# Patient Record
Sex: Male | Born: 1935 | Race: White | Hispanic: No | Marital: Married | Smoking: Former smoker
Health system: Southern US, Community
[De-identification: ages and names within clinical notes are randomized; demographics above are authoritative.]

## PROBLEM LIST (undated history)

## (undated) DIAGNOSIS — G8929 Other chronic pain: Secondary | ICD-10-CM

## (undated) DIAGNOSIS — F419 Anxiety disorder, unspecified: Secondary | ICD-10-CM

## (undated) DIAGNOSIS — R51 Headache: Secondary | ICD-10-CM

## (undated) DIAGNOSIS — M1990 Unspecified osteoarthritis, unspecified site: Secondary | ICD-10-CM

## (undated) DIAGNOSIS — K219 Gastro-esophageal reflux disease without esophagitis: Secondary | ICD-10-CM

## (undated) DIAGNOSIS — R519 Headache, unspecified: Secondary | ICD-10-CM

## (undated) DIAGNOSIS — I10 Essential (primary) hypertension: Secondary | ICD-10-CM

## (undated) DIAGNOSIS — J45909 Unspecified asthma, uncomplicated: Secondary | ICD-10-CM

## (undated) DIAGNOSIS — M545 Low back pain: Secondary | ICD-10-CM

## (undated) DIAGNOSIS — N419 Inflammatory disease of prostate, unspecified: Secondary | ICD-10-CM

## (undated) DIAGNOSIS — M25559 Pain in unspecified hip: Secondary | ICD-10-CM

## (undated) DIAGNOSIS — M542 Cervicalgia: Secondary | ICD-10-CM

## (undated) DIAGNOSIS — M4800 Spinal stenosis, site unspecified: Secondary | ICD-10-CM

## (undated) HISTORY — PX: TONSILLECTOMY: SUR1361

## (undated) HISTORY — PX: MELANOMA EXCISION: SHX5266

---
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
## 1998-08-07 ENCOUNTER — Ambulatory Visit (HOSPITAL_COMMUNITY): Admission: RE | Admit: 1998-08-07 | Discharge: 1998-08-07 | Payer: Self-pay | Admitting: Gastroenterology

## 1999-01-08 ENCOUNTER — Ambulatory Visit (HOSPITAL_COMMUNITY): Admission: RE | Admit: 1999-01-08 | Discharge: 1999-01-08 | Payer: Self-pay | Admitting: Gastroenterology

## 2005-06-24 ENCOUNTER — Encounter: Admission: RE | Admit: 2005-06-24 | Discharge: 2005-06-24 | Payer: Self-pay | Admitting: Family Medicine

## 2005-06-24 IMAGING — US US EXTREM LOW VENOUS*L*
1 series · 14 of 24 positions shown · non-contrast
Comparison: none

CLINICAL DATA: Left leg pain and swelling. 
 ULTRASOUND VENOUS IMAGING LEFT LEG:
TECHNIQUE: Gray-scale sonography with compression, as well as color and duplex Doppler ultrasound, were performed to evaluate the deep venous system from the level of the common femoral vein through the popliteal and proximal calf veins.

[Series 1: unknown · 14 of 35 slices shown]
[im 1/35]
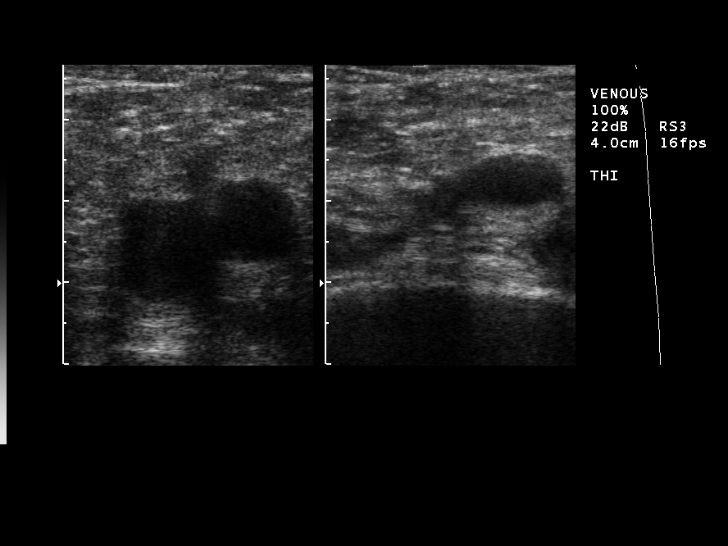
[im 3/35]
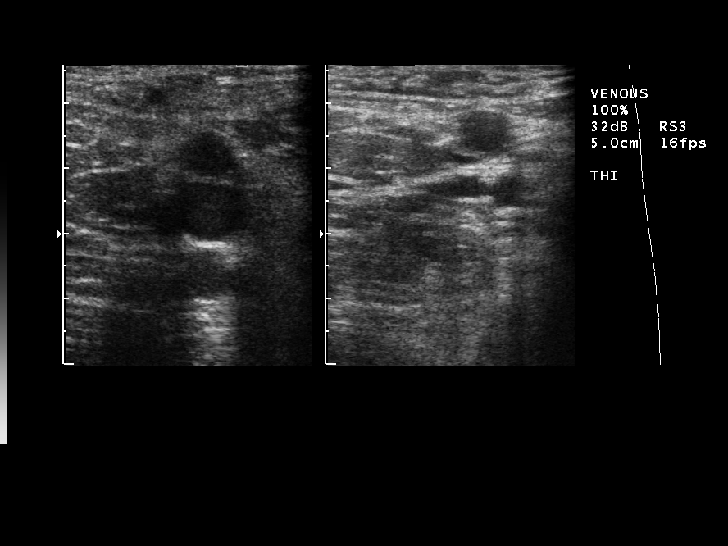
[im 6/35]
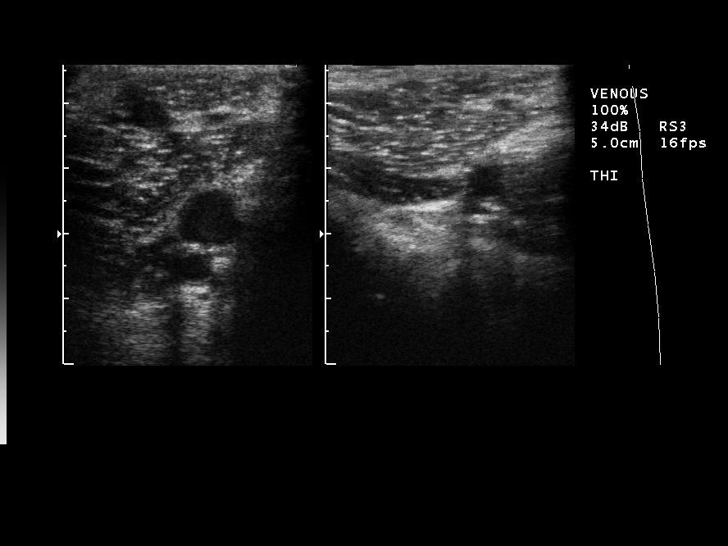
[im 9/35]
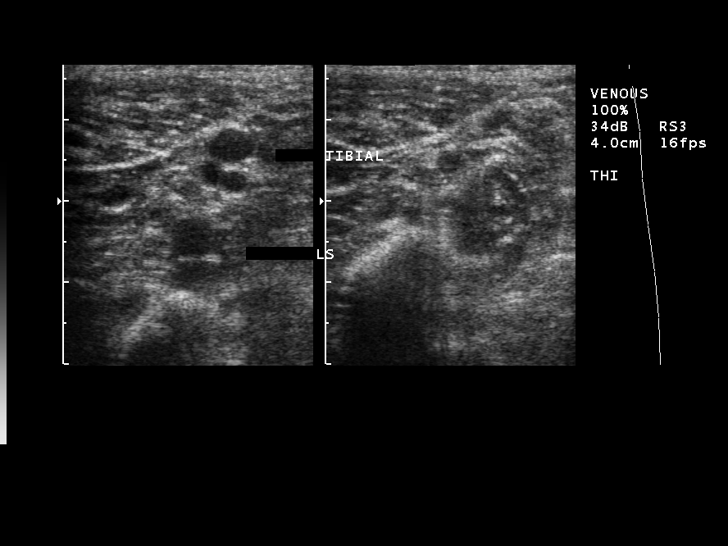
[im 11/35]
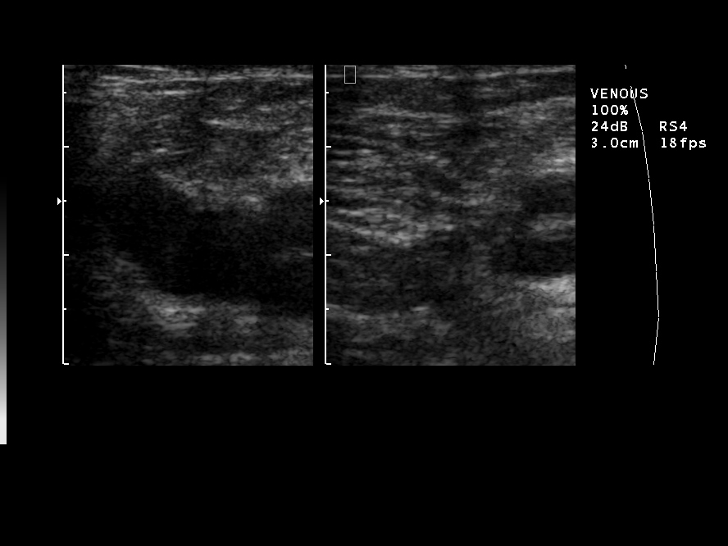
[im 14/35]
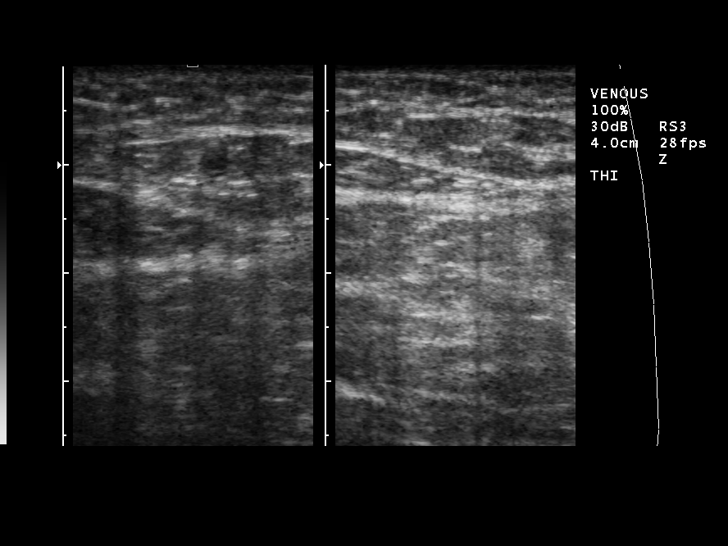
[im 17/35]
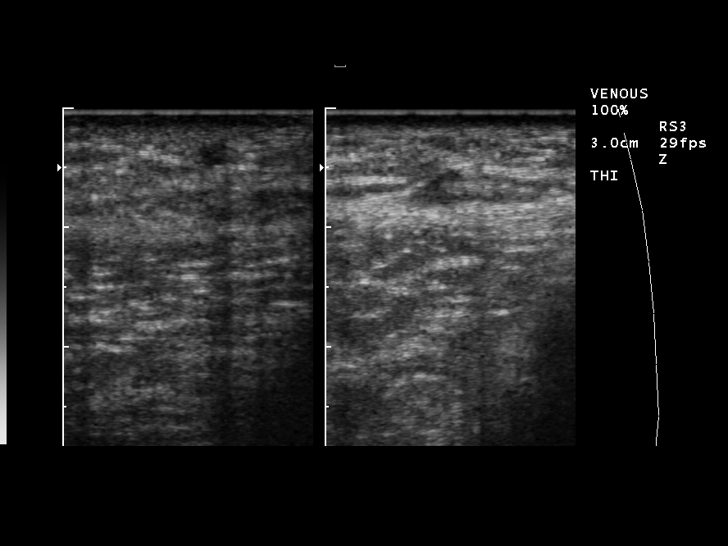
[im 18/35]
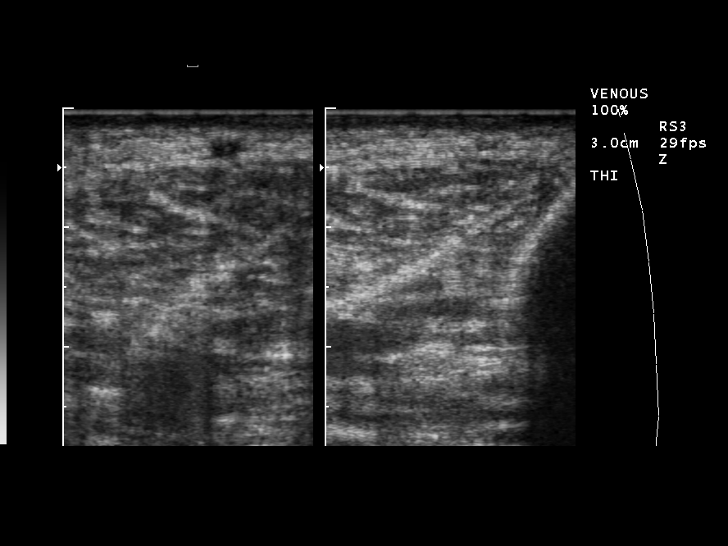
[im 21/35]
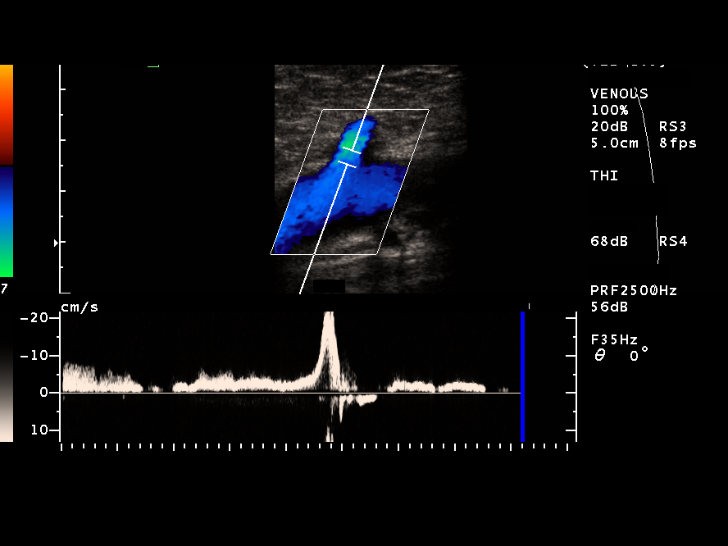
[im 24/35]
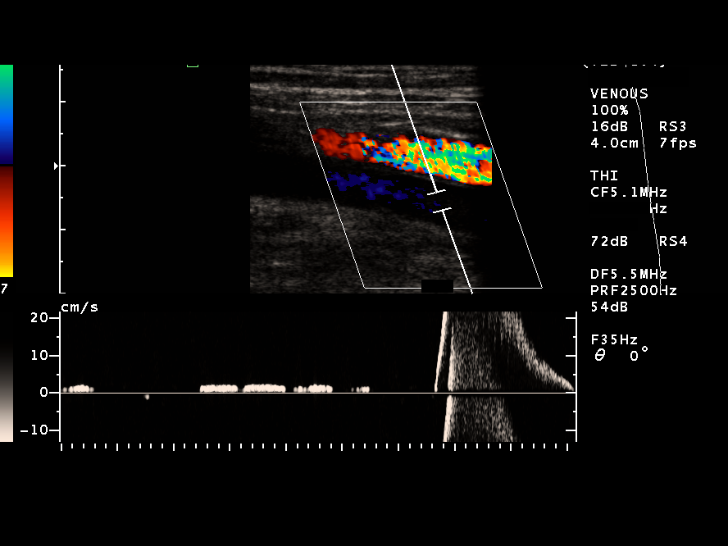
[im 27/35]
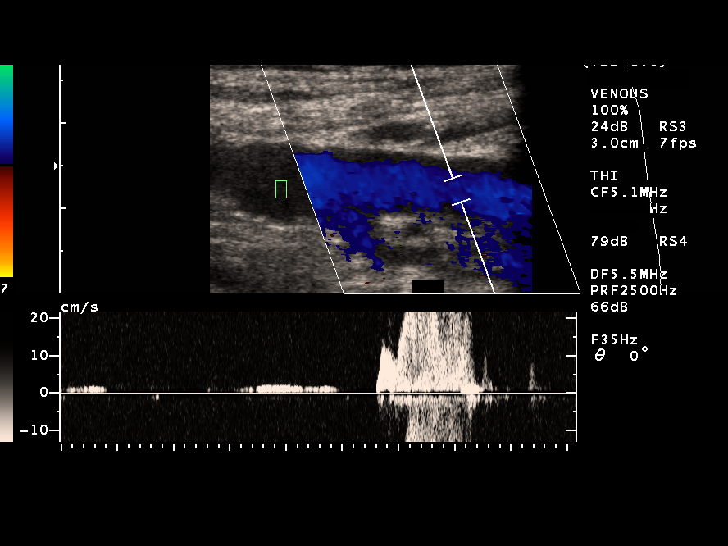
[im 29/35]
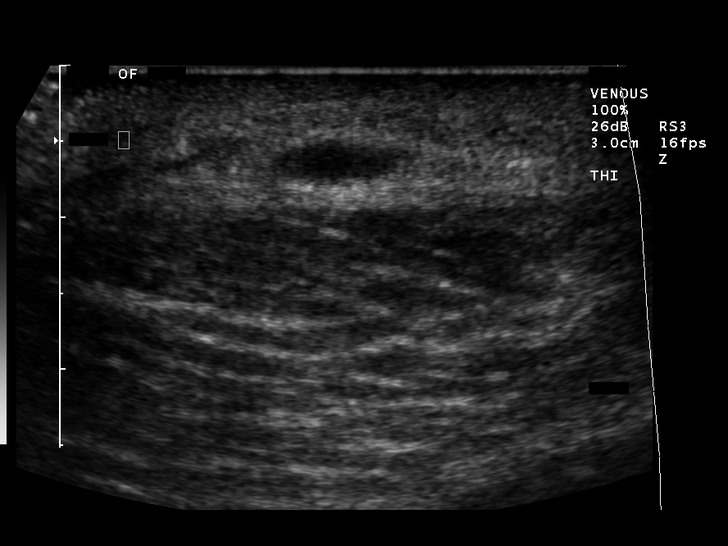
[im 32/35]
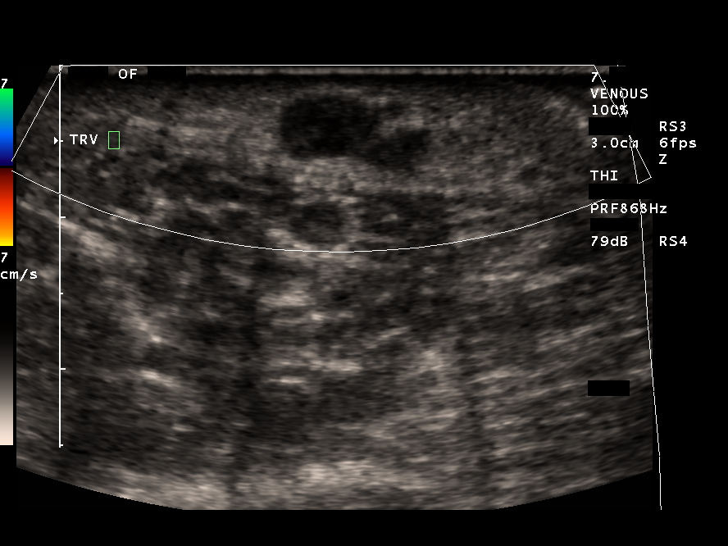
[im 35/35]
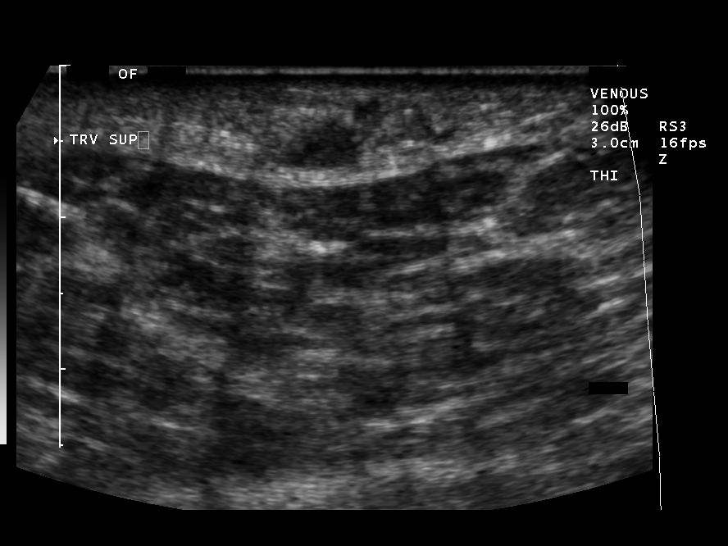

[14 of 24 positions shown; findings below may reference images not displayed]

FINDINGS: The left saphenous ? femoral junction, left common femoral vein, left profunda femoral vein, left superficial femoral vein, and left popliteal vein all compress and augment normally.  There is a small amount of fluid within the soft tissues of the mid medial left calf of approximately 2.3 x 0.6 x 1.1cm.  This may represent hematoma, but does not appear to be associated with the joint space in which case ruptured Baker?s cyst would be a consideration.
IMPRESSION: No evidence of DVT of the left leg.  Some fluid is noted in the soft tissues of the mid medial left calf which may represent hematoma.

## 2011-01-22 ENCOUNTER — Other Ambulatory Visit: Payer: Self-pay | Admitting: Dermatology

## 2011-03-17 ENCOUNTER — Other Ambulatory Visit: Payer: Self-pay | Admitting: Dermatology

## 2011-07-07 ENCOUNTER — Other Ambulatory Visit: Payer: Self-pay | Admitting: Dermatology

## 2012-04-21 ENCOUNTER — Other Ambulatory Visit: Payer: Self-pay | Admitting: Dermatology

## 2012-11-01 ENCOUNTER — Other Ambulatory Visit: Payer: Self-pay | Admitting: Dermatology

## 2013-07-28 DIAGNOSIS — M542 Cervicalgia: Secondary | ICD-10-CM | POA: Diagnosis not present

## 2013-07-31 DIAGNOSIS — M542 Cervicalgia: Secondary | ICD-10-CM | POA: Diagnosis not present

## 2013-08-03 DIAGNOSIS — M542 Cervicalgia: Secondary | ICD-10-CM | POA: Diagnosis not present

## 2013-08-07 DIAGNOSIS — M542 Cervicalgia: Secondary | ICD-10-CM | POA: Diagnosis not present

## 2013-08-10 DIAGNOSIS — M542 Cervicalgia: Secondary | ICD-10-CM | POA: Diagnosis not present

## 2013-08-14 DIAGNOSIS — M5030 Other cervical disc degeneration, unspecified cervical region: Secondary | ICD-10-CM | POA: Diagnosis not present

## 2013-09-24 DIAGNOSIS — M47812 Spondylosis without myelopathy or radiculopathy, cervical region: Secondary | ICD-10-CM | POA: Diagnosis not present

## 2013-09-26 DIAGNOSIS — M47812 Spondylosis without myelopathy or radiculopathy, cervical region: Secondary | ICD-10-CM | POA: Diagnosis not present

## 2013-11-20 DIAGNOSIS — Z0000 Encounter for general adult medical examination without abnormal findings: Secondary | ICD-10-CM | POA: Diagnosis not present

## 2013-11-20 DIAGNOSIS — N401 Enlarged prostate with lower urinary tract symptoms: Secondary | ICD-10-CM | POA: Diagnosis not present

## 2013-11-20 DIAGNOSIS — I10 Essential (primary) hypertension: Secondary | ICD-10-CM | POA: Diagnosis not present

## 2013-11-20 DIAGNOSIS — M47812 Spondylosis without myelopathy or radiculopathy, cervical region: Secondary | ICD-10-CM | POA: Diagnosis not present

## 2013-11-30 DIAGNOSIS — M47812 Spondylosis without myelopathy or radiculopathy, cervical region: Secondary | ICD-10-CM | POA: Diagnosis not present

## 2014-01-30 ENCOUNTER — Other Ambulatory Visit: Payer: Self-pay | Admitting: Dermatology

## 2014-01-30 DIAGNOSIS — L570 Actinic keratosis: Secondary | ICD-10-CM | POA: Diagnosis not present

## 2014-01-30 DIAGNOSIS — L821 Other seborrheic keratosis: Secondary | ICD-10-CM | POA: Diagnosis not present

## 2014-01-30 DIAGNOSIS — Z85828 Personal history of other malignant neoplasm of skin: Secondary | ICD-10-CM | POA: Diagnosis not present

## 2014-01-30 DIAGNOSIS — D485 Neoplasm of uncertain behavior of skin: Secondary | ICD-10-CM | POA: Diagnosis not present

## 2014-01-30 DIAGNOSIS — D239 Other benign neoplasm of skin, unspecified: Secondary | ICD-10-CM | POA: Diagnosis not present

## 2014-01-30 DIAGNOSIS — L723 Sebaceous cyst: Secondary | ICD-10-CM | POA: Diagnosis not present

## 2014-01-30 DIAGNOSIS — C44519 Basal cell carcinoma of skin of other part of trunk: Secondary | ICD-10-CM | POA: Diagnosis not present

## 2014-02-28 DIAGNOSIS — C44519 Basal cell carcinoma of skin of other part of trunk: Secondary | ICD-10-CM | POA: Diagnosis not present

## 2014-05-23 DIAGNOSIS — R7309 Other abnormal glucose: Secondary | ICD-10-CM | POA: Diagnosis not present

## 2014-05-23 DIAGNOSIS — E785 Hyperlipidemia, unspecified: Secondary | ICD-10-CM | POA: Diagnosis not present

## 2014-05-23 DIAGNOSIS — N401 Enlarged prostate with lower urinary tract symptoms: Secondary | ICD-10-CM | POA: Diagnosis not present

## 2014-05-23 DIAGNOSIS — J309 Allergic rhinitis, unspecified: Secondary | ICD-10-CM | POA: Diagnosis not present

## 2014-05-23 DIAGNOSIS — I10 Essential (primary) hypertension: Secondary | ICD-10-CM | POA: Diagnosis not present

## 2014-05-23 DIAGNOSIS — F418 Other specified anxiety disorders: Secondary | ICD-10-CM | POA: Diagnosis not present

## 2014-10-16 DIAGNOSIS — L821 Other seborrheic keratosis: Secondary | ICD-10-CM | POA: Diagnosis not present

## 2014-10-16 DIAGNOSIS — Z85828 Personal history of other malignant neoplasm of skin: Secondary | ICD-10-CM | POA: Diagnosis not present

## 2014-10-16 DIAGNOSIS — Z86018 Personal history of other benign neoplasm: Secondary | ICD-10-CM | POA: Diagnosis not present

## 2014-10-16 DIAGNOSIS — D225 Melanocytic nevi of trunk: Secondary | ICD-10-CM | POA: Diagnosis not present

## 2014-10-16 DIAGNOSIS — L723 Sebaceous cyst: Secondary | ICD-10-CM | POA: Diagnosis not present

## 2014-10-16 DIAGNOSIS — Q825 Congenital non-neoplastic nevus: Secondary | ICD-10-CM | POA: Diagnosis not present

## 2014-10-16 DIAGNOSIS — D2271 Melanocytic nevi of right lower limb, including hip: Secondary | ICD-10-CM | POA: Diagnosis not present

## 2014-10-16 DIAGNOSIS — L570 Actinic keratosis: Secondary | ICD-10-CM | POA: Diagnosis not present

## 2015-04-26 DIAGNOSIS — Z86018 Personal history of other benign neoplasm: Secondary | ICD-10-CM | POA: Diagnosis not present

## 2015-04-26 DIAGNOSIS — D2271 Melanocytic nevi of right lower limb, including hip: Secondary | ICD-10-CM | POA: Diagnosis not present

## 2015-04-26 DIAGNOSIS — D225 Melanocytic nevi of trunk: Secondary | ICD-10-CM | POA: Diagnosis not present

## 2015-04-26 DIAGNOSIS — L821 Other seborrheic keratosis: Secondary | ICD-10-CM | POA: Diagnosis not present

## 2015-04-26 DIAGNOSIS — L723 Sebaceous cyst: Secondary | ICD-10-CM | POA: Diagnosis not present

## 2015-04-26 DIAGNOSIS — Z85828 Personal history of other malignant neoplasm of skin: Secondary | ICD-10-CM | POA: Diagnosis not present

## 2015-04-26 DIAGNOSIS — D485 Neoplasm of uncertain behavior of skin: Secondary | ICD-10-CM | POA: Diagnosis not present

## 2015-06-05 DIAGNOSIS — E785 Hyperlipidemia, unspecified: Secondary | ICD-10-CM | POA: Diagnosis not present

## 2015-06-05 DIAGNOSIS — N138 Other obstructive and reflux uropathy: Secondary | ICD-10-CM | POA: Diagnosis not present

## 2015-06-05 DIAGNOSIS — F418 Other specified anxiety disorders: Secondary | ICD-10-CM | POA: Diagnosis not present

## 2015-06-05 DIAGNOSIS — I10 Essential (primary) hypertension: Secondary | ICD-10-CM | POA: Diagnosis not present

## 2015-06-05 DIAGNOSIS — N401 Enlarged prostate with lower urinary tract symptoms: Secondary | ICD-10-CM | POA: Diagnosis not present

## 2015-06-05 DIAGNOSIS — R7309 Other abnormal glucose: Secondary | ICD-10-CM | POA: Diagnosis not present

## 2015-07-03 DIAGNOSIS — N401 Enlarged prostate with lower urinary tract symptoms: Secondary | ICD-10-CM | POA: Diagnosis not present

## 2015-07-03 DIAGNOSIS — R361 Hematospermia: Secondary | ICD-10-CM | POA: Diagnosis not present

## 2015-07-03 DIAGNOSIS — R350 Frequency of micturition: Secondary | ICD-10-CM | POA: Diagnosis not present

## 2015-07-03 DIAGNOSIS — R3912 Poor urinary stream: Secondary | ICD-10-CM | POA: Diagnosis not present

## 2015-07-03 DIAGNOSIS — R351 Nocturia: Secondary | ICD-10-CM | POA: Diagnosis not present

## 2015-09-30 DIAGNOSIS — R310 Gross hematuria: Secondary | ICD-10-CM | POA: Diagnosis not present

## 2015-09-30 DIAGNOSIS — R350 Frequency of micturition: Secondary | ICD-10-CM | POA: Diagnosis not present

## 2015-09-30 DIAGNOSIS — R3912 Poor urinary stream: Secondary | ICD-10-CM | POA: Diagnosis not present

## 2015-09-30 DIAGNOSIS — N419 Inflammatory disease of prostate, unspecified: Secondary | ICD-10-CM | POA: Diagnosis not present

## 2015-09-30 DIAGNOSIS — R972 Elevated prostate specific antigen [PSA]: Secondary | ICD-10-CM | POA: Diagnosis not present

## 2015-09-30 DIAGNOSIS — Z0000 Encounter for general adult medical examination without abnormal findings: Secondary | ICD-10-CM | POA: Diagnosis not present

## 2015-10-10 DIAGNOSIS — Z0000 Encounter for general adult medical examination without abnormal findings: Secondary | ICD-10-CM | POA: Diagnosis not present

## 2015-10-10 DIAGNOSIS — R310 Gross hematuria: Secondary | ICD-10-CM | POA: Diagnosis not present

## 2015-10-10 DIAGNOSIS — N281 Cyst of kidney, acquired: Secondary | ICD-10-CM | POA: Diagnosis not present

## 2015-10-29 DIAGNOSIS — Z85828 Personal history of other malignant neoplasm of skin: Secondary | ICD-10-CM | POA: Diagnosis not present

## 2015-10-29 DIAGNOSIS — L723 Sebaceous cyst: Secondary | ICD-10-CM | POA: Diagnosis not present

## 2015-10-29 DIAGNOSIS — D1800 Hemangioma unspecified site: Secondary | ICD-10-CM | POA: Diagnosis not present

## 2015-10-29 DIAGNOSIS — D225 Melanocytic nevi of trunk: Secondary | ICD-10-CM | POA: Diagnosis not present

## 2015-10-29 DIAGNOSIS — L570 Actinic keratosis: Secondary | ICD-10-CM | POA: Diagnosis not present

## 2015-10-29 DIAGNOSIS — Z86018 Personal history of other benign neoplasm: Secondary | ICD-10-CM | POA: Diagnosis not present

## 2015-10-29 DIAGNOSIS — L821 Other seborrheic keratosis: Secondary | ICD-10-CM | POA: Diagnosis not present

## 2015-10-29 DIAGNOSIS — D2271 Melanocytic nevi of right lower limb, including hip: Secondary | ICD-10-CM | POA: Diagnosis not present

## 2015-11-02 DIAGNOSIS — M5136 Other intervertebral disc degeneration, lumbar region: Secondary | ICD-10-CM | POA: Diagnosis not present

## 2015-11-02 DIAGNOSIS — M9903 Segmental and somatic dysfunction of lumbar region: Secondary | ICD-10-CM | POA: Diagnosis not present

## 2015-11-02 DIAGNOSIS — M5137 Other intervertebral disc degeneration, lumbosacral region: Secondary | ICD-10-CM | POA: Diagnosis not present

## 2015-11-02 DIAGNOSIS — M9904 Segmental and somatic dysfunction of sacral region: Secondary | ICD-10-CM | POA: Diagnosis not present

## 2015-11-03 ENCOUNTER — Emergency Department (HOSPITAL_COMMUNITY)
Admission: EM | Admit: 2015-11-03 | Discharge: 2015-11-04 | Disposition: A | Discharge status: Home / Self Care | Payer: Medicare Other | Attending: Emergency Medicine | Admitting: Emergency Medicine

## 2015-11-03 ENCOUNTER — Encounter (HOSPITAL_COMMUNITY): Payer: Self-pay | Admitting: Emergency Medicine

## 2015-11-03 DIAGNOSIS — M25551 Pain in right hip: Secondary | ICD-10-CM | POA: Insufficient documentation

## 2015-11-03 DIAGNOSIS — R319 Hematuria, unspecified: Secondary | ICD-10-CM | POA: Diagnosis not present

## 2015-11-03 DIAGNOSIS — Z792 Long term (current) use of antibiotics: Secondary | ICD-10-CM | POA: Diagnosis not present

## 2015-11-03 DIAGNOSIS — Z880 Allergy status to penicillin: Secondary | ICD-10-CM | POA: Diagnosis not present

## 2015-11-03 DIAGNOSIS — M1990 Unspecified osteoarthritis, unspecified site: Secondary | ICD-10-CM | POA: Diagnosis not present

## 2015-11-03 DIAGNOSIS — Z7982 Long term (current) use of aspirin: Secondary | ICD-10-CM | POA: Insufficient documentation

## 2015-11-03 DIAGNOSIS — M549 Dorsalgia, unspecified: Secondary | ICD-10-CM | POA: Insufficient documentation

## 2015-11-03 DIAGNOSIS — I10 Essential (primary) hypertension: Secondary | ICD-10-CM | POA: Diagnosis not present

## 2015-11-03 DIAGNOSIS — Z79899 Other long term (current) drug therapy: Secondary | ICD-10-CM | POA: Diagnosis not present

## 2015-11-03 DIAGNOSIS — R52 Pain, unspecified: Secondary | ICD-10-CM

## 2015-11-03 HISTORY — DX: Essential (primary) hypertension: I10

## 2015-11-03 HISTORY — DX: Unspecified osteoarthritis, unspecified site: M19.90

## 2015-11-03 NOTE — ED Notes (Signed)
Pt x several weeks has had increasing pain in his R hip. Saw a chiropractor but it has only gotten worse. Worse upon standing from sitting position. Alert and oriented.

## 2015-11-04 ENCOUNTER — Emergency Department (HOSPITAL_COMMUNITY): Payer: Medicare Other

## 2015-11-04 DIAGNOSIS — M25551 Pain in right hip: Secondary | ICD-10-CM | POA: Diagnosis not present

## 2015-11-04 IMAGING — CR DG HIP (WITH OR WITHOUT PELVIS) 2-3V*R*
3 series · 3 of 3 positions shown · non-contrast
Comparison: None.

CLINICAL DATA: Right hip pain for 10 days, worsened today. No
trauma.

EXAM:
DG HIP (WITH OR WITHOUT PELVIS) 2-3V RIGHT

[t pelvis ap]
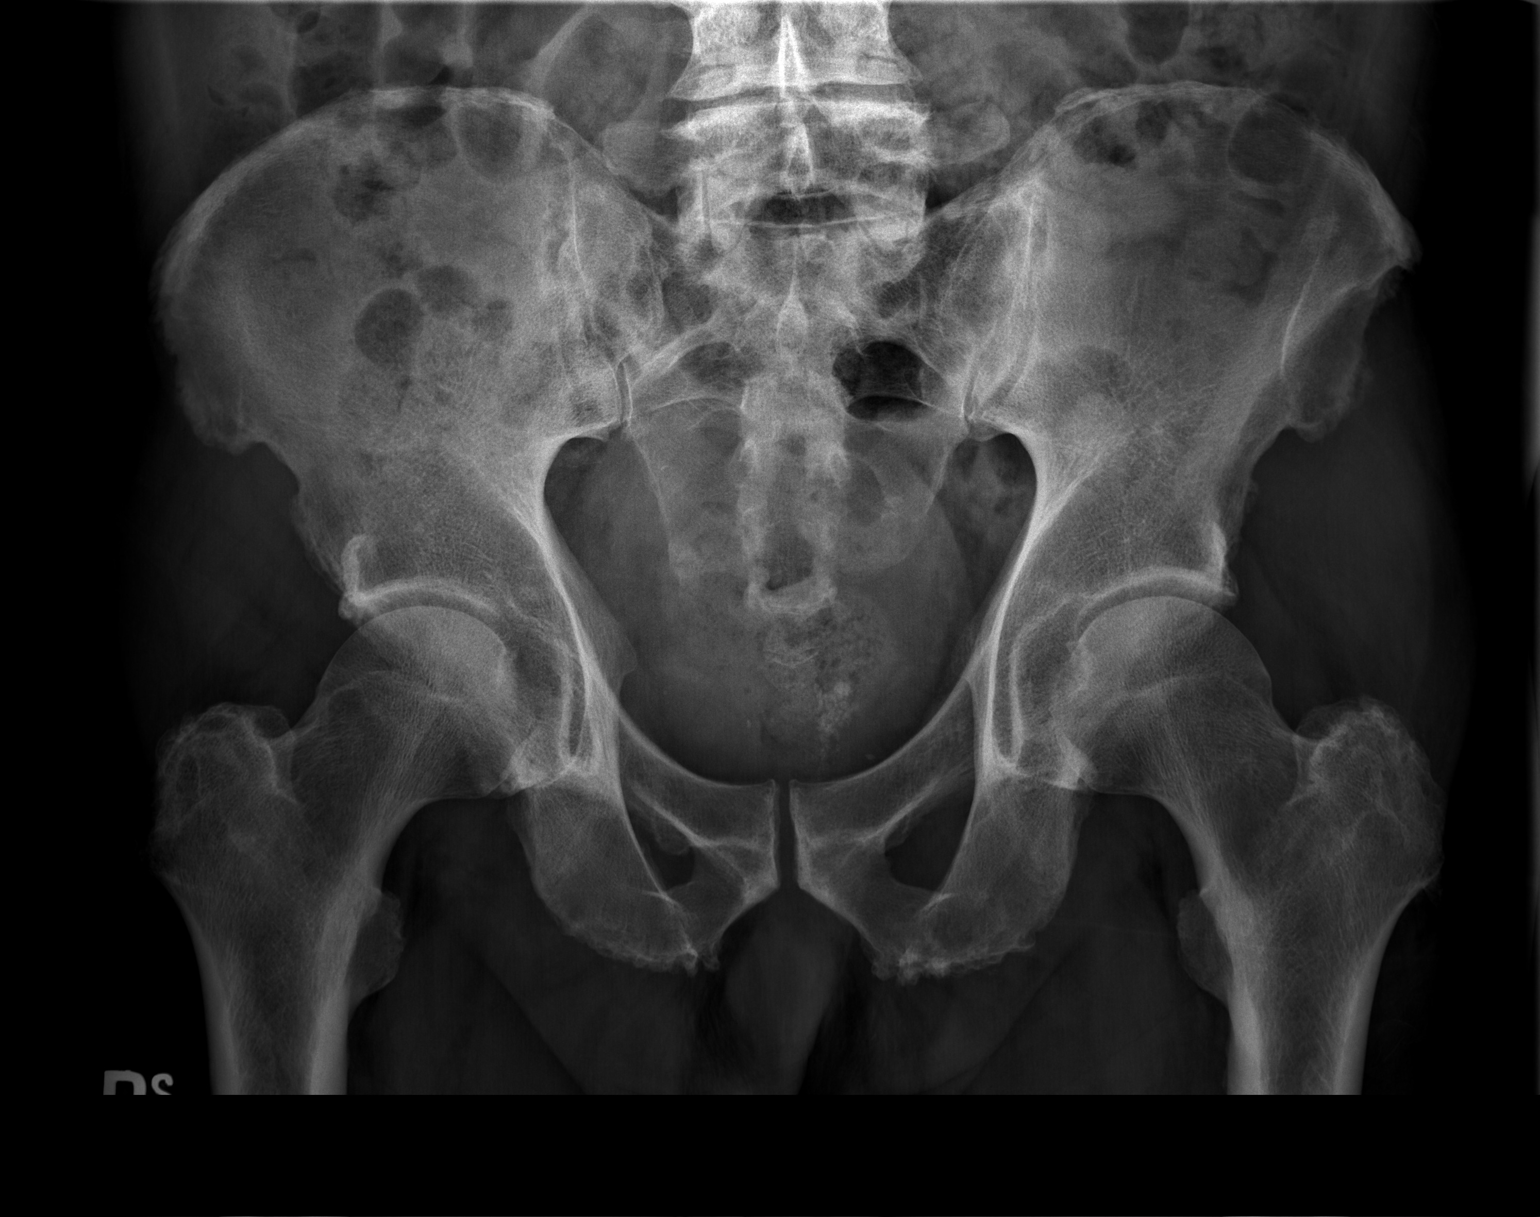

[t hip ap right]
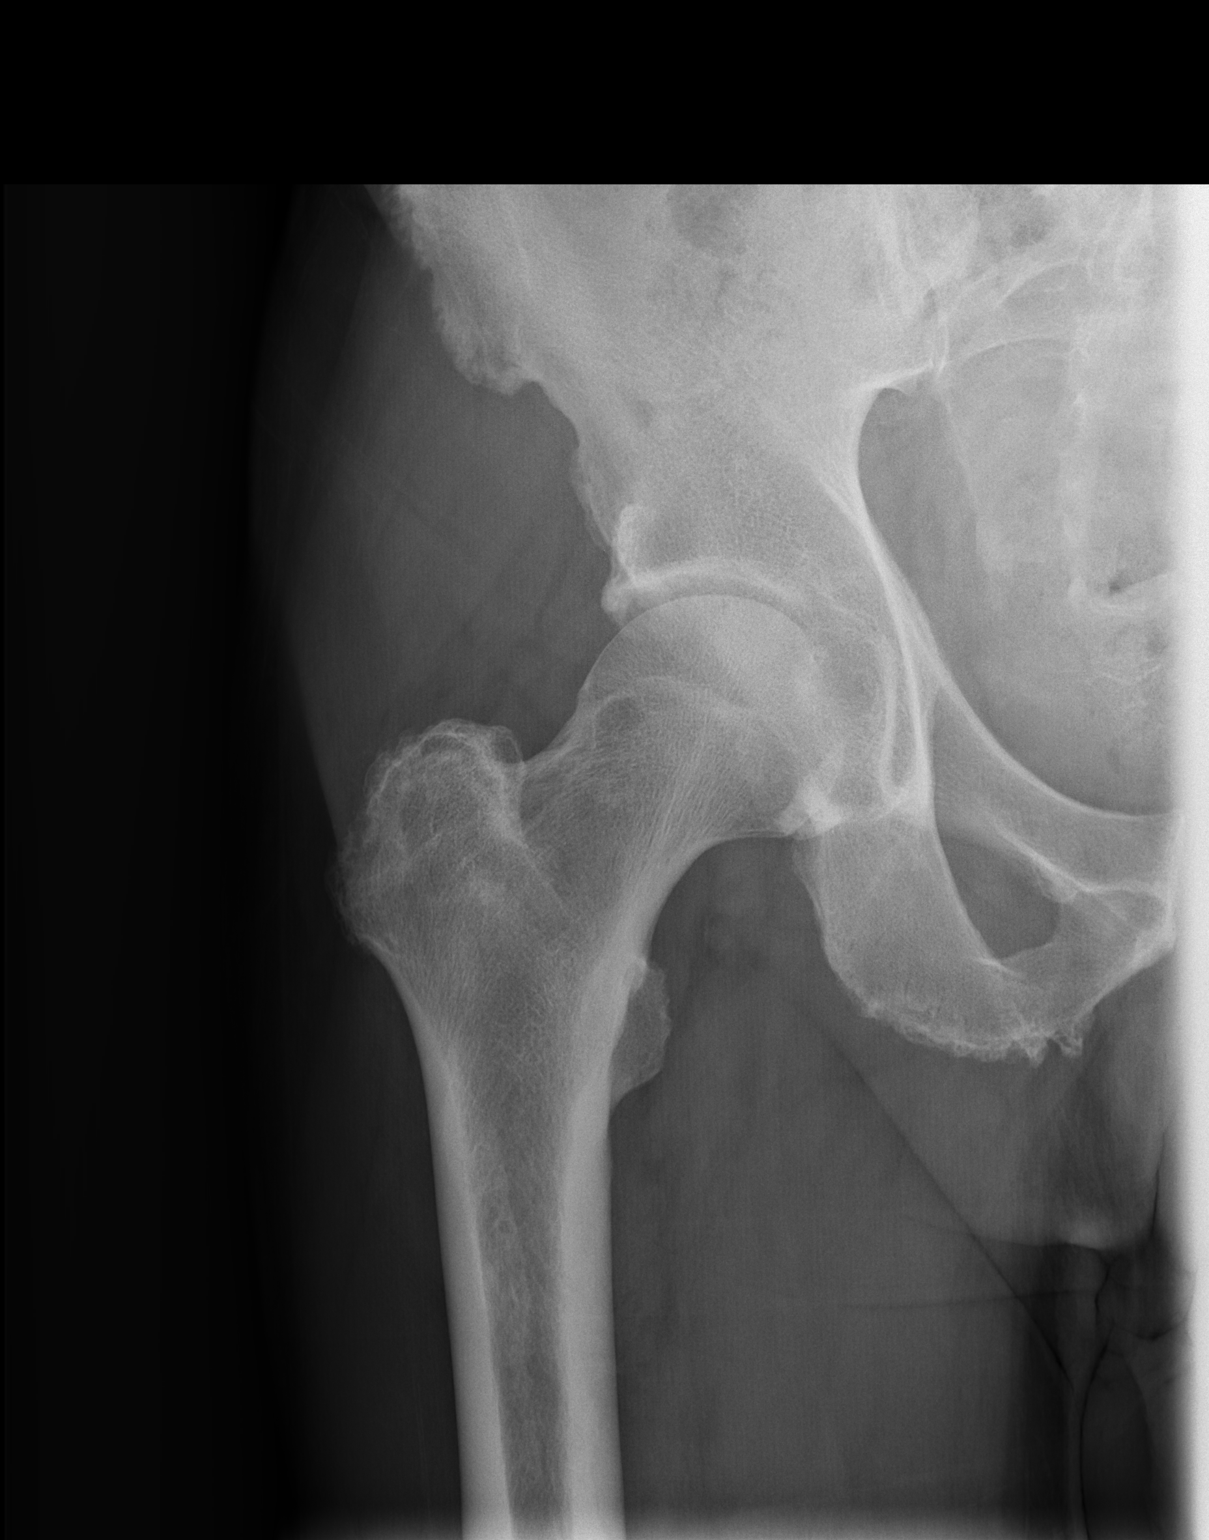

[t hip frog leg right]
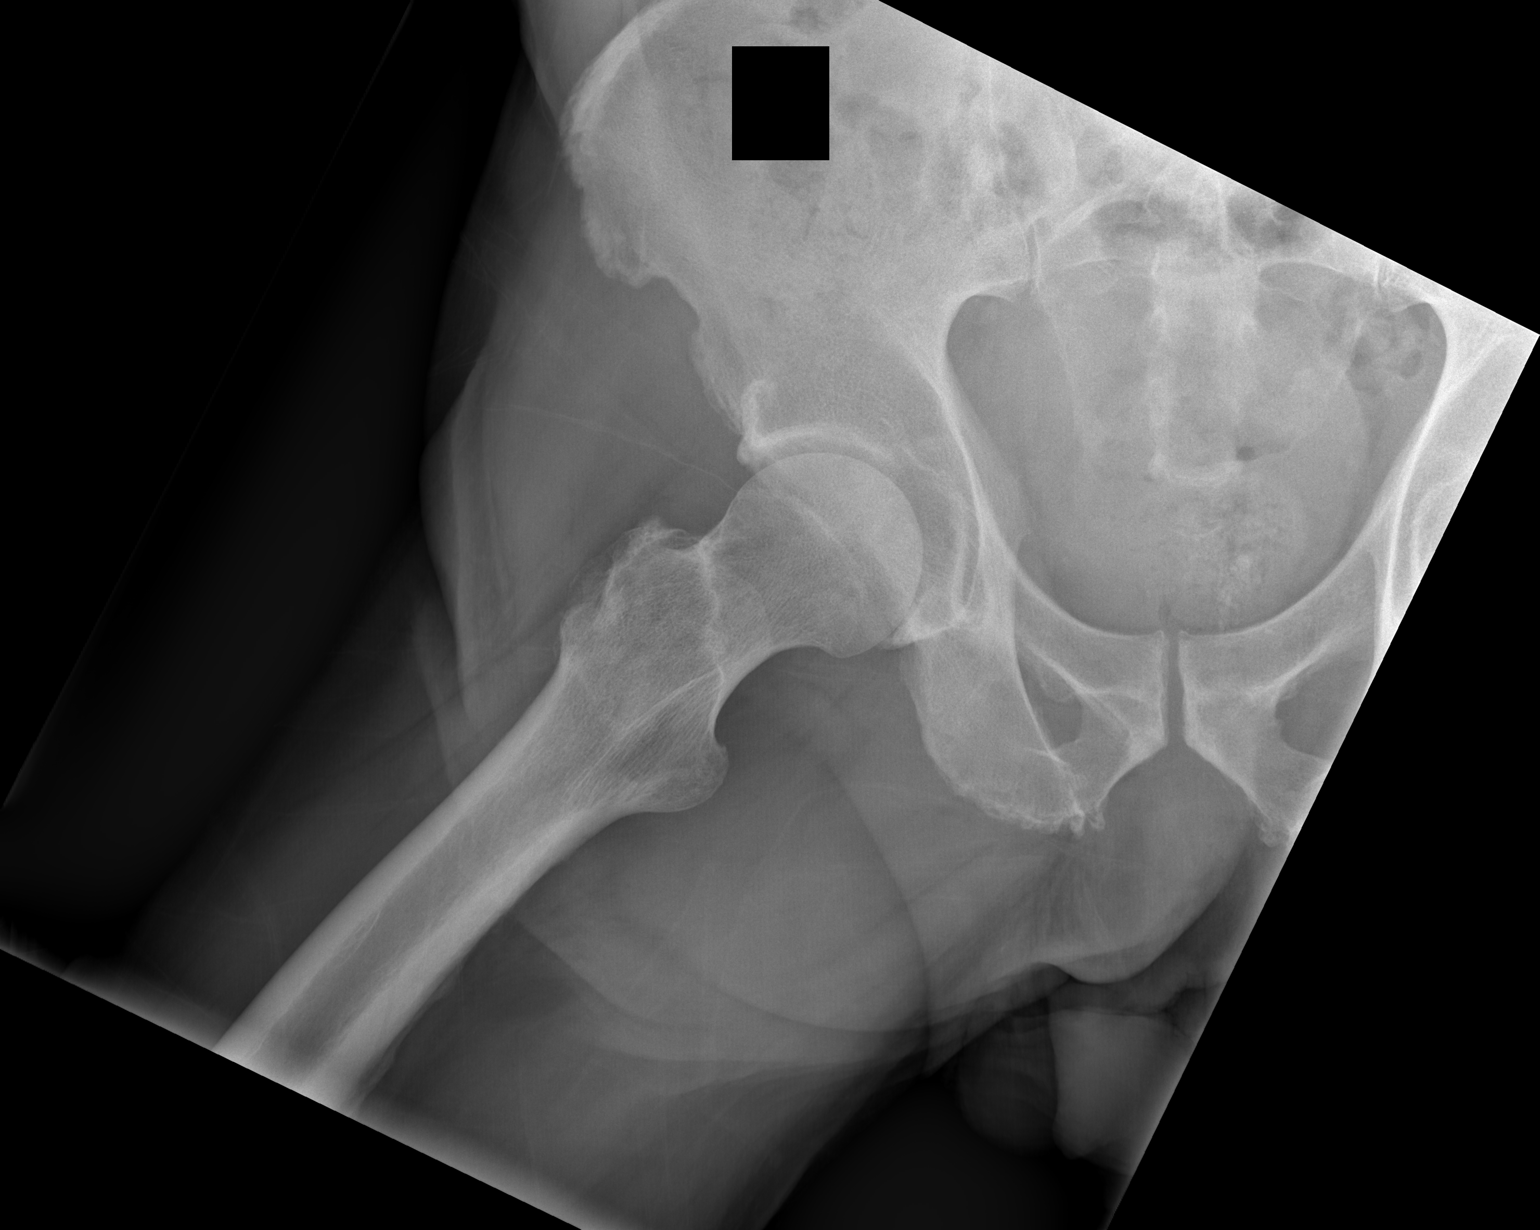

[3 of 3 positions shown; findings below may reference images not displayed]

FINDINGS: Negative for fracture, dislocation or radiopaque foreign body. Mild
right hip arthritic changes are present. No bone lesion or bony
destruction.
IMPRESSION: Negative.

## 2015-11-04 IMAGING — CT CT PELVIS W/O CM
2 of 4 series · 16 of 46 positions shown, 18 images · non-contrast
Comparison: [DATE]

CLINICAL DATA: Right hip and sacroiliac joint pain for 10 days.

EXAM:
CT PELVIS WITHOUT CONTRAST
TECHNIQUE: Multidetector CT imaging of the pelvis was performed following the
standard protocol without intravenous contrast.

[Series 3: pelvis st · axial · 0.70mm/px · z∈[+1014,+1214]mm · 13 of 48 slices shown, 15 images]
[im 4/48  soft-tissue]
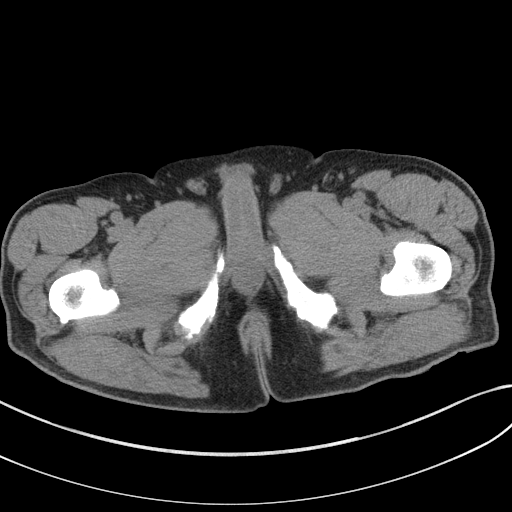
[im 4/48  bone]
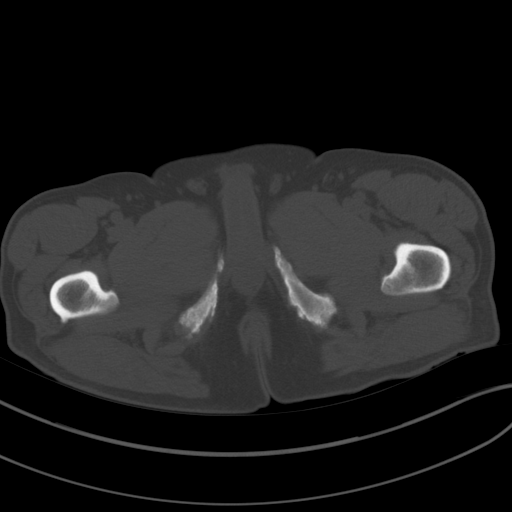
[im 7/48  soft-tissue]
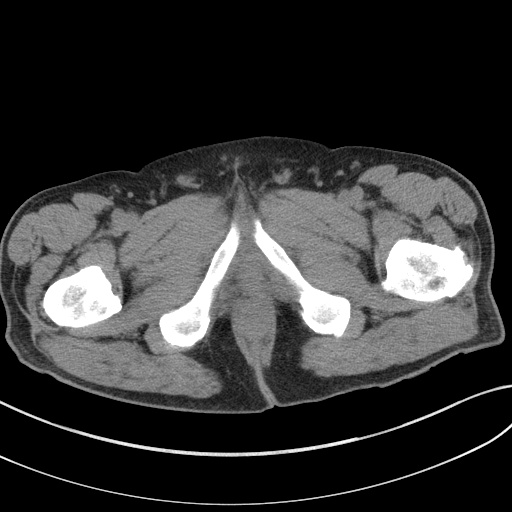
[im 10/48  soft-tissue]
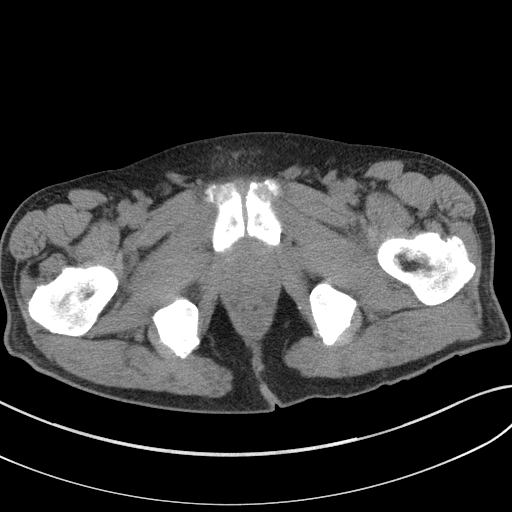
[im 14/48  soft-tissue]
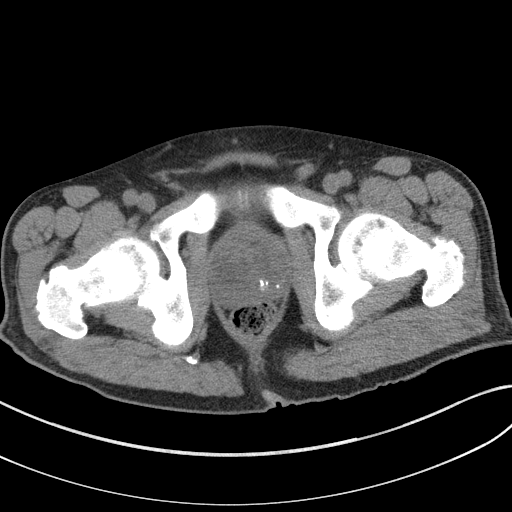
[im 17/48  soft-tissue]
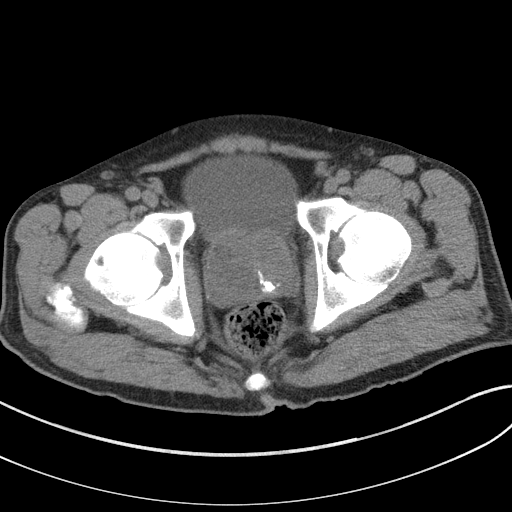
[im 20/48  soft-tissue]
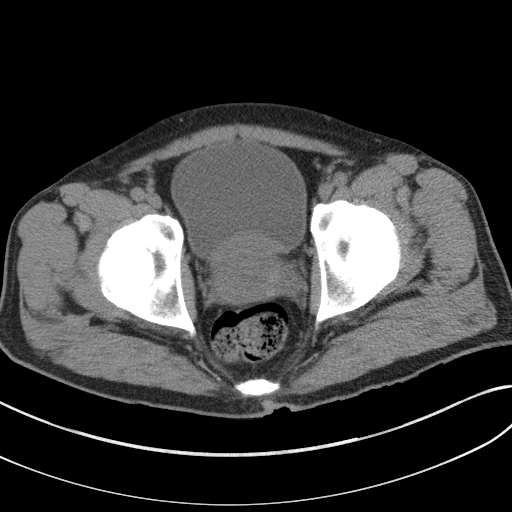
[im 25/48  soft-tissue]
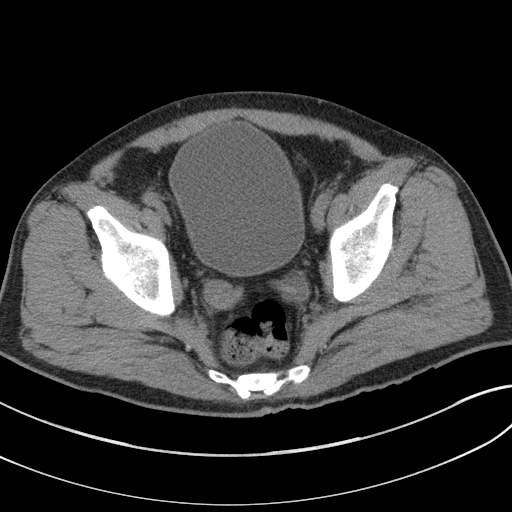
[im 28/48  soft-tissue]
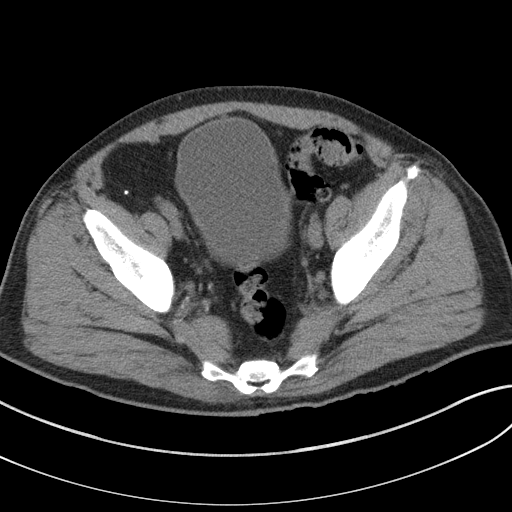
[im 31/48  soft-tissue]
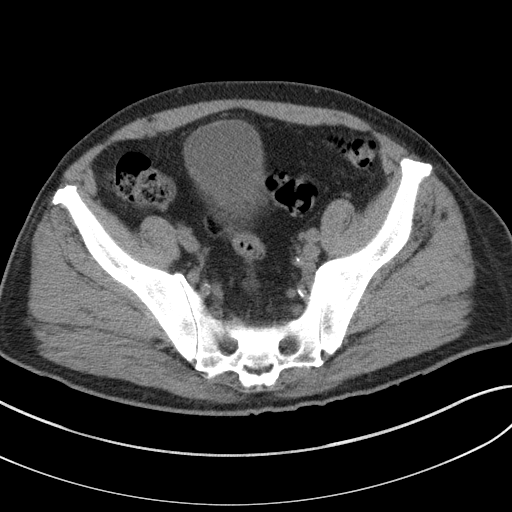
[im 31/48  bone]
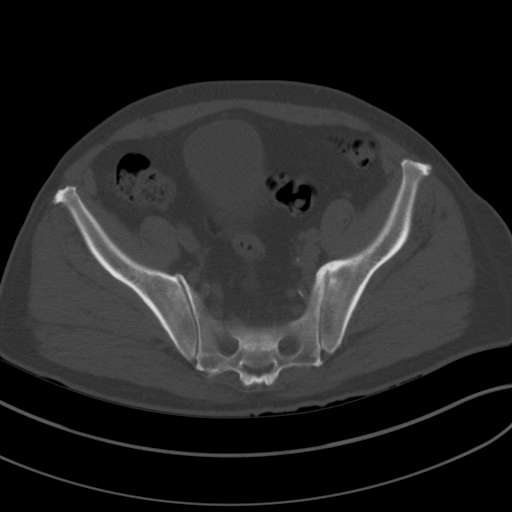
[im 34/48  soft-tissue]
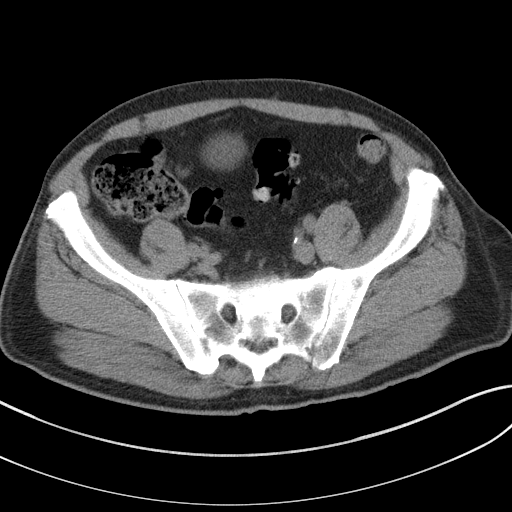
[im 38/48  soft-tissue]
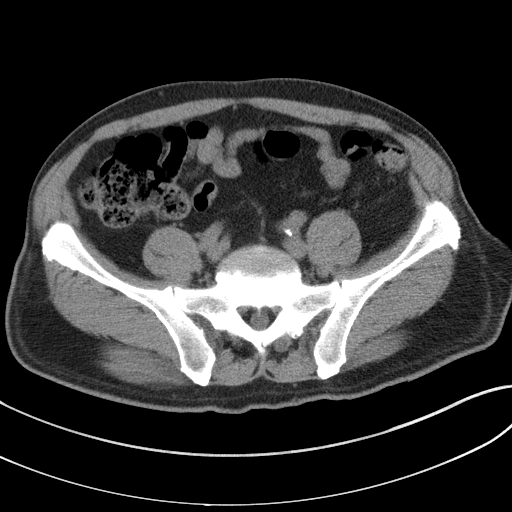
[im 41/48  soft-tissue]
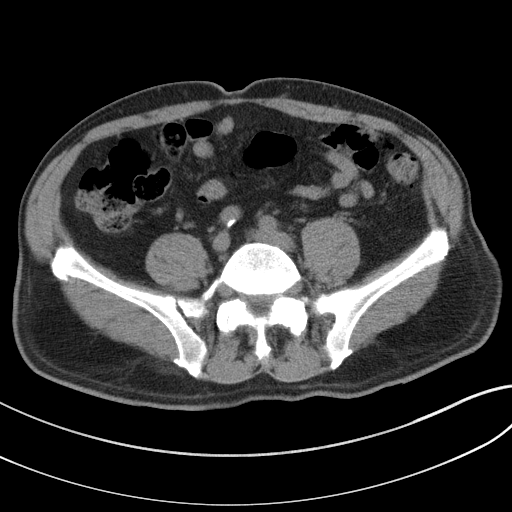
[im 44/48  soft-tissue]
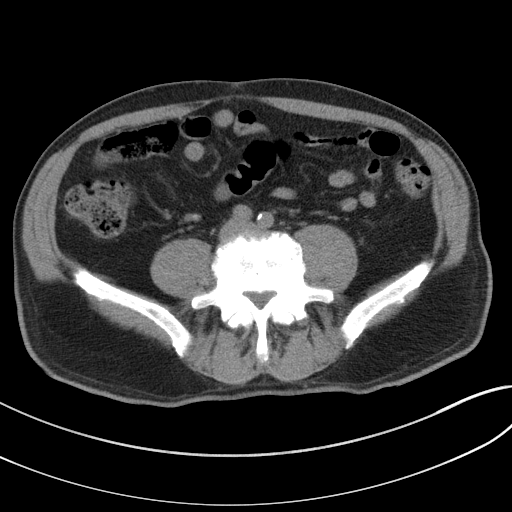

[Series 7: coronal images · coronal · 0.46mm/px · 3 of 113 slices shown]
[im 29/113  soft-tissue]
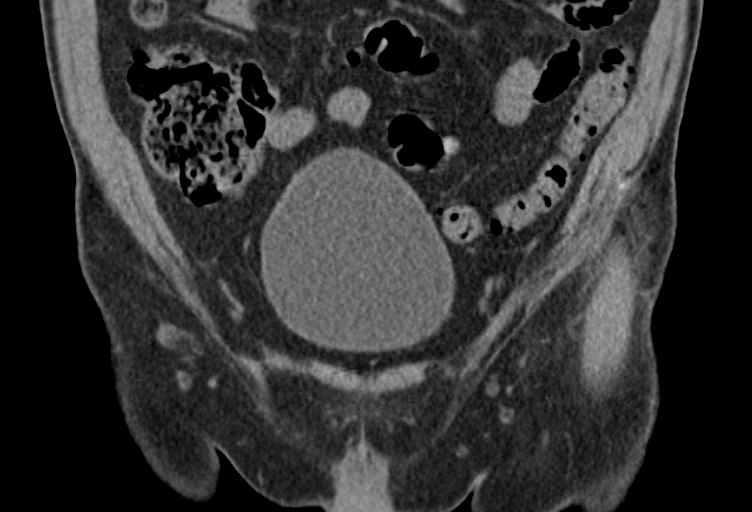
[im 57/113  soft-tissue]
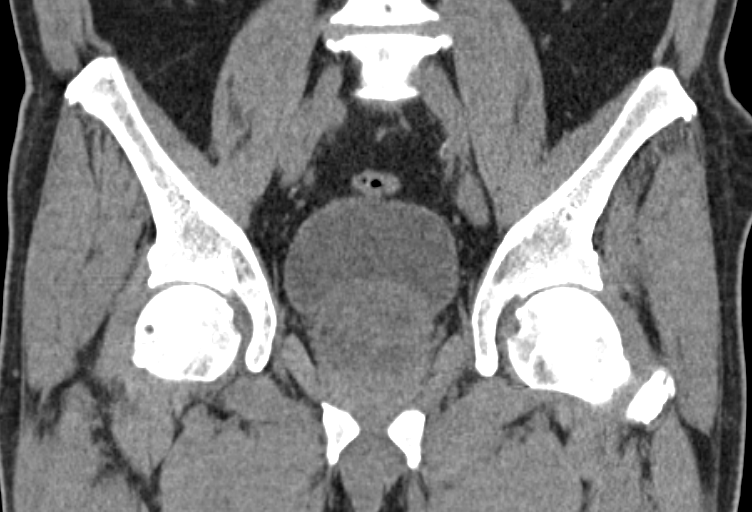
[im 85/113  soft-tissue]
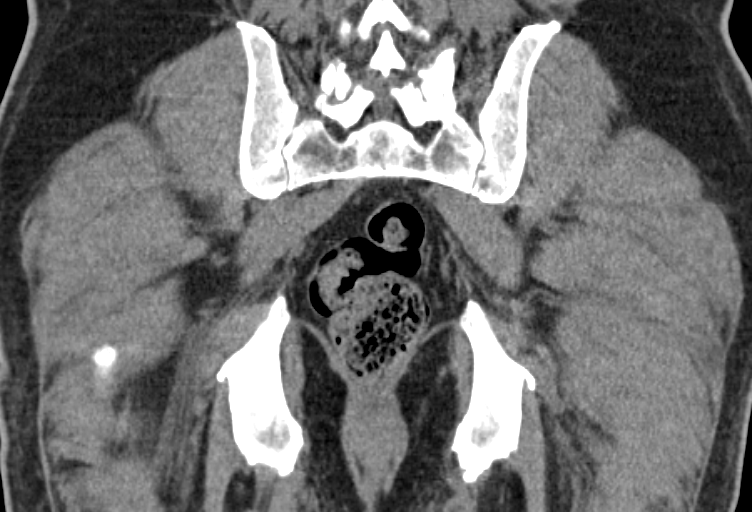

[16 of 46 positions shown; findings below may reference images not displayed]

FINDINGS: There is no fracture or dislocation. No bone lesion. No bony
destruction. Mild arthritic changes are present involving the right
hip. The sacroiliac joints and pubic symphysis are unremarkable. No
acute soft tissue abnormality is evident. No acute inflammatory
changes are evident. No abnormal fluid collections.
IMPRESSION: Mild right hip arthritic changes are present. No significant
abnormality is evident to account for the described pain.

## 2015-11-04 MED ORDER — OXYCODONE-ACETAMINOPHEN 5-325 MG PO TABS
1.0000 | ORAL_TABLET | Freq: Once | ORAL | Status: DC
  Filled 2015-11-04: qty 1

## 2015-11-04 MED ORDER — OXYCODONE-ACETAMINOPHEN 5-325 MG PO TABS
1.0000 | ORAL_TABLET | Freq: Once | ORAL | Status: AC
  Administered 2015-11-04: 1 via ORAL

## 2015-11-04 MED ORDER — DEXAMETHASONE SODIUM PHOSPHATE 10 MG/ML IJ SOLN
10.0000 mg | Freq: Once | INTRAMUSCULAR | Status: DC
  Filled 2015-11-04: qty 1

## 2015-11-04 MED ORDER — OXYCODONE-ACETAMINOPHEN 5-325 MG PO TABS
1.0000 | ORAL_TABLET | Freq: Once | ORAL | Status: AC
  Administered 2015-11-04: 1 via ORAL
  Filled 2015-11-04: qty 1

## 2015-11-04 MED ORDER — DEXAMETHASONE SODIUM PHOSPHATE 10 MG/ML IJ SOLN
10.0000 mg | Freq: Once | INTRAMUSCULAR | Status: AC
  Administered 2015-11-04: 10 mg via INTRAMUSCULAR

## 2015-11-04 MED ORDER — OXYCODONE-ACETAMINOPHEN 5-325 MG PO TABS: 1.0000 | ORAL_TABLET | Freq: Four times a day (QID) | ORAL | Status: DC | PRN

## 2015-11-04 NOTE — Discharge Instructions (Signed)
You were seen today for hip pain. The cause of your pain at this time is unknown. It could be related to arthritis. If your pain persists, he may need MRI for further evaluation. He'll be given a short course of pain medication. Continue meloxicam at home as well. If you have any new or worsening symptoms, or unable to ambulate, you should be reevaluated immediately.  Hip Pain Your hip is the joint between your upper legs and your lower pelvis. The bones, cartilage, tendons, and muscles of your hip joint perform a lot of work each day supporting your body weight and allowing you to move around. Hip pain can range from a minor ache to severe pain in one or both of your hips. Pain may be felt on the inside of the hip joint near the groin, or the outside near the buttocks and upper thigh. You may have swelling or stiffness as well.  HOME CARE INSTRUCTIONS   Take medicines only as directed by your health care provider.  Apply ice to the injured area:  Put ice in a plastic bag.  Place a towel between your skin and the bag.  Leave the ice on for 15-20 minutes at a time, 3-4 times a day.  Keep your leg raised (elevated) when possible to lessen swelling.  Avoid activities that cause pain.  Follow specific exercises as directed by your health care provider.  Sleep with a pillow between your legs on your most comfortable side.  Record how often you have hip pain, the location of the pain, and what it feels like. SEEK MEDICAL CARE IF:   You are unable to put weight on your leg.  Your hip is red or swollen or very tender to touch.  Your pain or swelling continues or worsens after 1 week.  You have increasing difficulty walking.  You have a fever. SEEK IMMEDIATE MEDICAL CARE IF:   You have fallen.  You have a sudden increase in pain and swelling in your hip. MAKE SURE YOU:   Understand these instructions.  Will watch your condition.  Will get help right away if you are not doing  well or get worse.   This information is not intended to replace advice given to you by your health care provider. Make sure you discuss any questions you have with your health care provider.   Document Released: 12/31/2009 Document Revised: 08/03/2014 Document Reviewed: 03/09/2013 Elsevier Interactive Patient Education [employer].

## 2015-11-04 NOTE — ED Provider Notes (Addendum)
CSN: [id number]     Arrival date & time 11/03/15  2312 History  By signing my name below, I, [name], attest that this documentation has been prepared under the direction and in the presence of Merryl Hacker, MD . Electronically Signed: [name], Scribe. 11/04/2015. 12:39 AM.   Chief Complaint  Patient presents with  . Hip Pain   The history is provided by the patient. No language interpreter was used.   HPI Comments:  Corey [name] is a 82 y.o. male with PMHx of arthritis and HTN who presents to the Emergency Department complaining of worsening 10/10 right hip pain for the past 10 days, worsening yesterday after a 3 mile walk. He states pain is sharp when getting out of the car, sitting down and standing up. Pt reports associated back pain. He was treated by a chiropractor for possible inflammation without relief. No other treatments attempted PTA.Pt normally walks 3 miles a day. He reports recent evaluation for hematuria and cystoscopy with no results; he was started on Cipro, developed a rash, and was switched to Keflex. Denies numbness in legs, incontinence, difficulty urinating, or recent injury.  Past Medical History  Diagnosis Date  . Arthritis   . Hypertension    Past Surgical History  Procedure Laterality Date  . Tonsillectomy     No family history on file. Social History  Substance Use Topics  . Smoking status: Never Smoker   . Smokeless tobacco: Not on file  . Alcohol Use: No    Review of Systems  Constitutional: Negative for fever.  Genitourinary: Positive for hematuria. Negative for difficulty urinating.  Musculoskeletal: Positive for back pain and arthralgias (right hip).  Neurological: Negative for weakness and numbness.  All other systems reviewed and are negative.  Allergies  Ciprofloxacin; Penicillins; and Sulfa antibiotics  Home Medications   Prior to Admission medications   Medication Sig Start Date End Date Taking? Authorizing  Provider  aspirin EC 81 MG tablet Take 81 mg by mouth daily.   Yes Historical Provider, MD  cephALEXin (KEFLEX) 500 MG capsule Take 500 mg by mouth 3 (three) times daily. 10/23/15  Yes Historical Provider, MD  Glucosamine HCl (GLUCOSAMINE PO) Take 2 tablets by mouth 2 (two) times daily.   Yes Historical Provider, MD  losartan (COZAAR) 50 MG tablet Take 50 mg by mouth daily.   Yes Historical Provider, MD  meloxicam (MOBIC) 15 MG tablet Take 15 mg by mouth daily.   Yes Historical Provider, MD  POTASSIUM PO Take 500 mg by mouth daily after lunch.   Yes Historical Provider, MD  tamsulosin (FLOMAX) 0.4 MG CAPS capsule Take 0.4 mg by mouth daily. 07/30/15  Yes Historical Provider, MD  Turmeric 500 MG TABS Take 500 mg by mouth daily.   Yes Historical Provider, MD  vitamin C (ASCORBIC ACID) 500 MG tablet Take 500 mg by mouth 2 (two) times daily.   Yes Historical Provider, MD  oxyCODONE-acetaminophen (PERCOCET/ROXICET) 5-325 MG tablet Take 1 tablet by mouth every 6 (six) hours as needed for severe pain. 11/04/15   Merryl Hacker, MD   BP 149/86 mmHg  Pulse 60  Temp(Src) 97.9 F (36.6 C) (Oral)  Resp 20  SpO2 98% Physical Exam  Constitutional: He is oriented to person, place, and time. He appears well-developed and well-nourished.  Appears younger than stated age  HENT:  Head: Normocephalic and atraumatic.  Cardiovascular: Normal rate, regular rhythm and normal heart sounds.   No murmur heard. Pulmonary/Chest: Effort normal  and breath sounds normal. No respiratory distress. He has no wheezes.  Abdominal: Soft. There is no tenderness.  Musculoskeletal: He exhibits no edema.  Normal range of motion the right hip, no obvious deformities, no significant tenderness palpation, patient does have tenderness palpation of the right SI joint   Neurological: He is alert and oriented to person, place, and time.  5 out of 5 strength bilateral lower extremities, normal reflexes  Skin: Skin is warm and dry.   Psychiatric: He has a normal mood and affect.  Nursing note and vitals reviewed.   ED Course  Procedures  DIAGNOSTIC STUDIES:  Oxygen Saturation is 99% on RA, normal by my interpretation.    COORDINATION OF CARE:  12:36 AM Will order x-ray of hips and pelvis and administer pain medication. Discussed treatment plan with pt at bedside and pt agreed to plan.  Labs Review Labs Reviewed - No data to display  Imaging Review Ct Pelvis Wo Contrast  11/04/2015  CLINICAL DATA:  Right hip and sacroiliac joint pain for 10 days. EXAM: CT PELVIS WITHOUT CONTRAST TECHNIQUE: Multidetector CT imaging of the pelvis was performed following the standard protocol without intravenous contrast. COMPARISON:  10/10/2015 FINDINGS: There is no fracture or dislocation. No bone lesion. No bony destruction. Mild arthritic changes are present involving the right hip. The sacroiliac joints and pubic symphysis are unremarkable. No acute soft tissue abnormality is evident. No acute inflammatory changes are evident. No abnormal fluid collections. IMPRESSION: Mild right hip arthritic changes are present. No significant abnormality is evident to account for the described pain. Electronically Signed   By: Andreas Newport M.D.   On: 11/04/2015 02:13   Dg Hip Unilat With Pelvis 2-3 Views Right  11/04/2015  CLINICAL DATA:  Right hip pain for 10 days, worsened today. No trauma. EXAM: DG HIP (WITH OR WITHOUT PELVIS) 2-3V RIGHT COMPARISON:  None. FINDINGS: Negative for fracture, dislocation or radiopaque foreign body. Mild right hip arthritic changes are present. No bone lesion or bony destruction. IMPRESSION: Negative. Electronically Signed   By: Andreas Newport M.D.   On: 11/04/2015 01:30   I have personally reviewed and evaluated these images and lab results as part of my medical decision-making.   EKG Interpretation None      MDM   Final diagnoses:  Right hip pain    Patient presents with persistent right hip  pain. Ongoing for the last 10 days. No obvious deformities. No injury. Has seen a chiropractor and reportedly had x-rays that were negative. Acute worsening of pain today. He is nontoxic on exam. He is actually tender over his right SI joint. Plain films are negative. CT scan of the pelvis was obtained with imaging through the hip. This is all reassuring. He does have evidence of arthritis. Patient was given Percocet with relief of his pain. He was also given IM Decadron for anti-inflammatory effect. He takes meloxicam at home. He was able to ambulate with minimal assist.  Will discharge home with a short course of Percocet. No obvious fracture at this time. If pain persists, patient will likely need MRI imaging to further evaluate for soft tissue or nerve abnormality.  After history, exam, and medical workup I feel the patient has been appropriately medically screened and is safe for discharge home. Pertinent diagnoses were discussed with the patient. Patient was given return precautions.  I personally performed the services described in this documentation, which was scribed in my presence. The recorded information has been reviewed and is accurate.  Merryl Hacker, MD 11/04/15 [id number]  Merryl Hacker, MD 11/04/15 [id number]

## 2015-11-06 DIAGNOSIS — N401 Enlarged prostate with lower urinary tract symptoms: Secondary | ICD-10-CM | POA: Insufficient documentation

## 2015-11-06 DIAGNOSIS — I10 Essential (primary) hypertension: Secondary | ICD-10-CM | POA: Insufficient documentation

## 2015-11-06 DIAGNOSIS — E785 Hyperlipidemia, unspecified: Secondary | ICD-10-CM | POA: Insufficient documentation

## 2015-11-06 DIAGNOSIS — K219 Gastro-esophageal reflux disease without esophagitis: Secondary | ICD-10-CM | POA: Insufficient documentation

## 2015-11-06 DIAGNOSIS — J309 Allergic rhinitis, unspecified: Secondary | ICD-10-CM | POA: Insufficient documentation

## 2015-11-06 DIAGNOSIS — R7303 Prediabetes: Secondary | ICD-10-CM | POA: Insufficient documentation

## 2015-11-06 DIAGNOSIS — R5383 Other fatigue: Secondary | ICD-10-CM | POA: Insufficient documentation

## 2015-11-06 DIAGNOSIS — H9110 Presbycusis, unspecified ear: Secondary | ICD-10-CM | POA: Insufficient documentation

## 2015-11-06 DIAGNOSIS — F411 Generalized anxiety disorder: Secondary | ICD-10-CM | POA: Insufficient documentation

## 2015-11-06 DIAGNOSIS — M25551 Pain in right hip: Secondary | ICD-10-CM | POA: Diagnosis not present

## 2015-11-06 DIAGNOSIS — N138 Other obstructive and reflux uropathy: Secondary | ICD-10-CM | POA: Insufficient documentation

## 2015-11-06 DIAGNOSIS — L570 Actinic keratosis: Secondary | ICD-10-CM | POA: Insufficient documentation

## 2015-11-07 DIAGNOSIS — M545 Low back pain: Secondary | ICD-10-CM | POA: Diagnosis not present

## 2015-11-12 DIAGNOSIS — M545 Low back pain: Secondary | ICD-10-CM | POA: Diagnosis not present

## 2015-11-14 DIAGNOSIS — M545 Low back pain: Secondary | ICD-10-CM | POA: Diagnosis not present

## 2015-11-14 DIAGNOSIS — M4806 Spinal stenosis, lumbar region: Secondary | ICD-10-CM | POA: Diagnosis not present

## 2015-11-29 DIAGNOSIS — Z0000 Encounter for general adult medical examination without abnormal findings: Secondary | ICD-10-CM | POA: Diagnosis not present

## 2015-11-29 DIAGNOSIS — R310 Gross hematuria: Secondary | ICD-10-CM | POA: Diagnosis not present

## 2015-11-29 DIAGNOSIS — M545 Low back pain: Secondary | ICD-10-CM | POA: Diagnosis not present

## 2015-12-06 DIAGNOSIS — M4806 Spinal stenosis, lumbar region: Secondary | ICD-10-CM | POA: Diagnosis not present

## 2015-12-10 DIAGNOSIS — M545 Low back pain: Secondary | ICD-10-CM | POA: Diagnosis not present

## 2015-12-12 DIAGNOSIS — M545 Low back pain: Secondary | ICD-10-CM | POA: Diagnosis not present

## 2015-12-16 DIAGNOSIS — M545 Low back pain: Secondary | ICD-10-CM | POA: Diagnosis not present

## 2015-12-19 DIAGNOSIS — M545 Low back pain: Secondary | ICD-10-CM | POA: Diagnosis not present

## 2015-12-24 DIAGNOSIS — M545 Low back pain: Secondary | ICD-10-CM | POA: Diagnosis not present

## 2015-12-26 DIAGNOSIS — M545 Low back pain: Secondary | ICD-10-CM | POA: Diagnosis not present

## 2016-02-18 DIAGNOSIS — L723 Sebaceous cyst: Secondary | ICD-10-CM | POA: Diagnosis not present

## 2016-04-02 DIAGNOSIS — L720 Epidermal cyst: Secondary | ICD-10-CM | POA: Diagnosis not present

## 2016-05-01 DIAGNOSIS — Z23 Encounter for immunization: Secondary | ICD-10-CM | POA: Diagnosis not present

## 2016-05-01 DIAGNOSIS — L723 Sebaceous cyst: Secondary | ICD-10-CM | POA: Diagnosis not present

## 2016-05-15 DIAGNOSIS — D2271 Melanocytic nevi of right lower limb, including hip: Secondary | ICD-10-CM | POA: Diagnosis not present

## 2016-05-15 DIAGNOSIS — Z85828 Personal history of other malignant neoplasm of skin: Secondary | ICD-10-CM | POA: Diagnosis not present

## 2016-05-15 DIAGNOSIS — L821 Other seborrheic keratosis: Secondary | ICD-10-CM | POA: Diagnosis not present

## 2016-05-15 DIAGNOSIS — L723 Sebaceous cyst: Secondary | ICD-10-CM | POA: Diagnosis not present

## 2016-05-15 DIAGNOSIS — Z86018 Personal history of other benign neoplasm: Secondary | ICD-10-CM | POA: Diagnosis not present

## 2016-05-15 DIAGNOSIS — D225 Melanocytic nevi of trunk: Secondary | ICD-10-CM | POA: Diagnosis not present

## 2016-05-15 DIAGNOSIS — L570 Actinic keratosis: Secondary | ICD-10-CM | POA: Diagnosis not present

## 2016-05-15 DIAGNOSIS — Z23 Encounter for immunization: Secondary | ICD-10-CM | POA: Diagnosis not present

## 2016-06-03 DIAGNOSIS — M47816 Spondylosis without myelopathy or radiculopathy, lumbar region: Secondary | ICD-10-CM | POA: Insufficient documentation

## 2016-06-03 DIAGNOSIS — M4692 Unspecified inflammatory spondylopathy, cervical region: Secondary | ICD-10-CM | POA: Diagnosis not present

## 2016-06-03 DIAGNOSIS — Z125 Encounter for screening for malignant neoplasm of prostate: Secondary | ICD-10-CM | POA: Diagnosis not present

## 2016-06-03 DIAGNOSIS — N401 Enlarged prostate with lower urinary tract symptoms: Secondary | ICD-10-CM | POA: Diagnosis not present

## 2016-06-03 DIAGNOSIS — M25552 Pain in left hip: Secondary | ICD-10-CM | POA: Insufficient documentation

## 2016-06-03 DIAGNOSIS — R972 Elevated prostate specific antigen [PSA]: Secondary | ICD-10-CM | POA: Insufficient documentation

## 2016-06-03 DIAGNOSIS — N138 Other obstructive and reflux uropathy: Secondary | ICD-10-CM | POA: Diagnosis not present

## 2016-06-03 DIAGNOSIS — F418 Other specified anxiety disorders: Secondary | ICD-10-CM | POA: Diagnosis not present

## 2016-06-03 DIAGNOSIS — I10 Essential (primary) hypertension: Secondary | ICD-10-CM | POA: Diagnosis not present

## 2016-06-03 DIAGNOSIS — M4696 Unspecified inflammatory spondylopathy, lumbar region: Secondary | ICD-10-CM | POA: Diagnosis not present

## 2016-06-03 DIAGNOSIS — E7800 Pure hypercholesterolemia, unspecified: Secondary | ICD-10-CM | POA: Diagnosis not present

## 2016-06-03 DIAGNOSIS — M25551 Pain in right hip: Secondary | ICD-10-CM | POA: Insufficient documentation

## 2016-06-05 DIAGNOSIS — L723 Sebaceous cyst: Secondary | ICD-10-CM | POA: Diagnosis not present

## 2016-06-23 DIAGNOSIS — R350 Frequency of micturition: Secondary | ICD-10-CM | POA: Diagnosis not present

## 2016-06-23 DIAGNOSIS — N401 Enlarged prostate with lower urinary tract symptoms: Secondary | ICD-10-CM | POA: Diagnosis not present

## 2016-10-06 DIAGNOSIS — H2513 Age-related nuclear cataract, bilateral: Secondary | ICD-10-CM | POA: Diagnosis not present

## 2016-12-04 DIAGNOSIS — L723 Sebaceous cyst: Secondary | ICD-10-CM | POA: Diagnosis not present

## 2016-12-04 DIAGNOSIS — Z85828 Personal history of other malignant neoplasm of skin: Secondary | ICD-10-CM | POA: Diagnosis not present

## 2016-12-04 DIAGNOSIS — Z86018 Personal history of other benign neoplasm: Secondary | ICD-10-CM | POA: Diagnosis not present

## 2016-12-04 DIAGNOSIS — D225 Melanocytic nevi of trunk: Secondary | ICD-10-CM | POA: Diagnosis not present

## 2016-12-04 DIAGNOSIS — L570 Actinic keratosis: Secondary | ICD-10-CM | POA: Diagnosis not present

## 2016-12-04 DIAGNOSIS — D2271 Melanocytic nevi of right lower limb, including hip: Secondary | ICD-10-CM | POA: Diagnosis not present

## 2016-12-04 DIAGNOSIS — D485 Neoplasm of uncertain behavior of skin: Secondary | ICD-10-CM | POA: Diagnosis not present

## 2016-12-04 DIAGNOSIS — C44511 Basal cell carcinoma of skin of breast: Secondary | ICD-10-CM | POA: Diagnosis not present

## 2016-12-04 DIAGNOSIS — C44619 Basal cell carcinoma of skin of left upper limb, including shoulder: Secondary | ICD-10-CM | POA: Diagnosis not present

## 2017-01-12 DIAGNOSIS — C44511 Basal cell carcinoma of skin of breast: Secondary | ICD-10-CM | POA: Diagnosis not present

## 2017-01-12 DIAGNOSIS — C44619 Basal cell carcinoma of skin of left upper limb, including shoulder: Secondary | ICD-10-CM | POA: Diagnosis not present

## 2017-02-16 DIAGNOSIS — M4692 Unspecified inflammatory spondylopathy, cervical region: Secondary | ICD-10-CM | POA: Diagnosis not present

## 2017-02-16 DIAGNOSIS — M542 Cervicalgia: Secondary | ICD-10-CM | POA: Diagnosis not present

## 2017-02-16 DIAGNOSIS — M25512 Pain in left shoulder: Secondary | ICD-10-CM | POA: Diagnosis not present

## 2017-02-17 ENCOUNTER — Encounter (HOSPITAL_COMMUNITY): Payer: Self-pay | Admitting: Emergency Medicine

## 2017-02-17 ENCOUNTER — Emergency Department (HOSPITAL_COMMUNITY): Payer: Medicare Other

## 2017-02-17 ENCOUNTER — Observation Stay (HOSPITAL_COMMUNITY)
Admission: EM | Admit: 2017-02-17 | Discharge: 2017-02-18 | Disposition: A | Discharge status: Home / Self Care | Payer: Medicare Other | Attending: Cardiology | Admitting: Cardiology

## 2017-02-17 DIAGNOSIS — Z87891 Personal history of nicotine dependence: Secondary | ICD-10-CM | POA: Diagnosis not present

## 2017-02-17 DIAGNOSIS — I10 Essential (primary) hypertension: Secondary | ICD-10-CM | POA: Diagnosis not present

## 2017-02-17 DIAGNOSIS — Y69 Unspecified misadventure during surgical and medical care: Secondary | ICD-10-CM | POA: Diagnosis not present

## 2017-02-17 DIAGNOSIS — J45909 Unspecified asthma, uncomplicated: Secondary | ICD-10-CM | POA: Insufficient documentation

## 2017-02-17 DIAGNOSIS — Z79899 Other long term (current) drug therapy: Secondary | ICD-10-CM | POA: Diagnosis not present

## 2017-02-17 DIAGNOSIS — R001 Bradycardia, unspecified: Secondary | ICD-10-CM | POA: Diagnosis not present

## 2017-02-17 DIAGNOSIS — Z7982 Long term (current) use of aspirin: Secondary | ICD-10-CM | POA: Insufficient documentation

## 2017-02-17 DIAGNOSIS — R112 Nausea with vomiting, unspecified: Secondary | ICD-10-CM | POA: Diagnosis not present

## 2017-02-17 DIAGNOSIS — R55 Syncope and collapse: Secondary | ICD-10-CM

## 2017-02-17 DIAGNOSIS — I442 Atrioventricular block, complete: Principal | ICD-10-CM | POA: Diagnosis present

## 2017-02-17 DIAGNOSIS — I499 Cardiac arrhythmia, unspecified: Secondary | ICD-10-CM | POA: Diagnosis not present

## 2017-02-17 DIAGNOSIS — R110 Nausea: Secondary | ICD-10-CM | POA: Diagnosis not present

## 2017-02-17 DIAGNOSIS — T428X5A Adverse effect of antiparkinsonism drugs and other central muscle-tone depressants, initial encounter: Secondary | ICD-10-CM | POA: Insufficient documentation

## 2017-02-17 DIAGNOSIS — R404 Transient alteration of awareness: Secondary | ICD-10-CM | POA: Diagnosis not present

## 2017-02-17 DIAGNOSIS — R531 Weakness: Secondary | ICD-10-CM | POA: Diagnosis not present

## 2017-02-17 HISTORY — DX: Unspecified asthma, uncomplicated: J45.909

## 2017-02-17 HISTORY — DX: Low back pain: M54.5

## 2017-02-17 HISTORY — DX: Spinal stenosis, site unspecified: M48.00

## 2017-02-17 HISTORY — DX: Headache, unspecified: R51.9

## 2017-02-17 HISTORY — DX: Anxiety disorder, unspecified: F41.9

## 2017-02-17 HISTORY — DX: Cervicalgia: M54.2

## 2017-02-17 HISTORY — DX: Gastro-esophageal reflux disease without esophagitis: K21.9

## 2017-02-17 HISTORY — DX: Inflammatory disease of prostate, unspecified: N41.9

## 2017-02-17 HISTORY — DX: Pain in unspecified hip: M25.559

## 2017-02-17 HISTORY — DX: Headache: R51

## 2017-02-17 HISTORY — DX: Other chronic pain: G89.29

## 2017-02-17 LAB — CBC
HCT: 37.8 % — ABNORMAL LOW (ref 39.0–52.0)
Hemoglobin: 13 g/dL (ref 13.0–17.0)
MCH: 28.5 pg (ref 26.0–34.0)
MCHC: 34.4 g/dL (ref 30.0–36.0)
MCV: 82.9 fL (ref 78.0–100.0)
Platelets: 124 10*3/uL — ABNORMAL LOW (ref 150–400)
RBC: 4.56 MIL/uL (ref 4.22–5.81)
RDW: 13 % (ref 11.5–15.5)
WBC: 10.9 10*3/uL — ABNORMAL HIGH (ref 4.0–10.5)

## 2017-02-17 LAB — BASIC METABOLIC PANEL
Anion gap: 8 (ref 5–15)
BUN: 18 mg/dL (ref 6–20)
CO2: 24 mmol/L (ref 22–32)
CREATININE: 0.93 mg/dL (ref 0.61–1.24)
Calcium: 8.4 mg/dL — ABNORMAL LOW (ref 8.9–10.3)
Chloride: 108 mmol/L (ref 101–111)
GFR calc non Af Amer: >60 mL/min (ref >60)
Glucose, Bld: 185 mg/dL — ABNORMAL HIGH (ref 65–99)
Potassium: 3.8 mmol/L (ref 3.5–5.1)
SODIUM: 140 mmol/L (ref 135–145)

## 2017-02-17 LAB — BRAIN NATRIURETIC PEPTIDE: B Natriuretic Peptide: 28.6 pg/mL (ref 0.0–100.0)

## 2017-02-17 LAB — I-STAT TROPONIN, ED: TROPONIN I, POC: 0 ng/mL (ref 0.00–0.08)

## 2017-02-17 LAB — I-STAT CHEM 8, ED
BUN: 20 mg/dL (ref 6–20)
CALCIUM ION: 1.14 mmol/L — AB (ref 1.15–1.40)
CREATININE: 0.9 mg/dL (ref 0.61–1.24)
Chloride: 106 mmol/L (ref 101–111)
GLUCOSE: 184 mg/dL — AB (ref 65–99)
HCT: 37 % — ABNORMAL LOW (ref 39.0–52.0)
HEMOGLOBIN: 12.6 g/dL — AB (ref 13.0–17.0)
POTASSIUM: 3.8 mmol/L (ref 3.5–5.1)
Sodium: 140 mmol/L (ref 135–145)
TCO2: 24 mmol/L (ref 0–100)

## 2017-02-17 LAB — TSH: TSH: 0.629 u[IU]/mL (ref 0.350–4.500)

## 2017-02-17 LAB — MAGNESIUM: MAGNESIUM: 1.9 mg/dL (ref 1.7–2.4)

## 2017-02-17 LAB — PHOSPHORUS: Phosphorus: 2.7 mg/dL (ref 2.5–4.6)

## 2017-02-17 LAB — TROPONIN I

## 2017-02-17 IMAGING — DX DG CHEST 1V PORT
1 series · 1 of 1 positions shown · non-contrast
Comparison: None.

CLINICAL DATA: Arrhythmia

EXAM:
PORTABLE CHEST 1 VIEW

[chest ap]
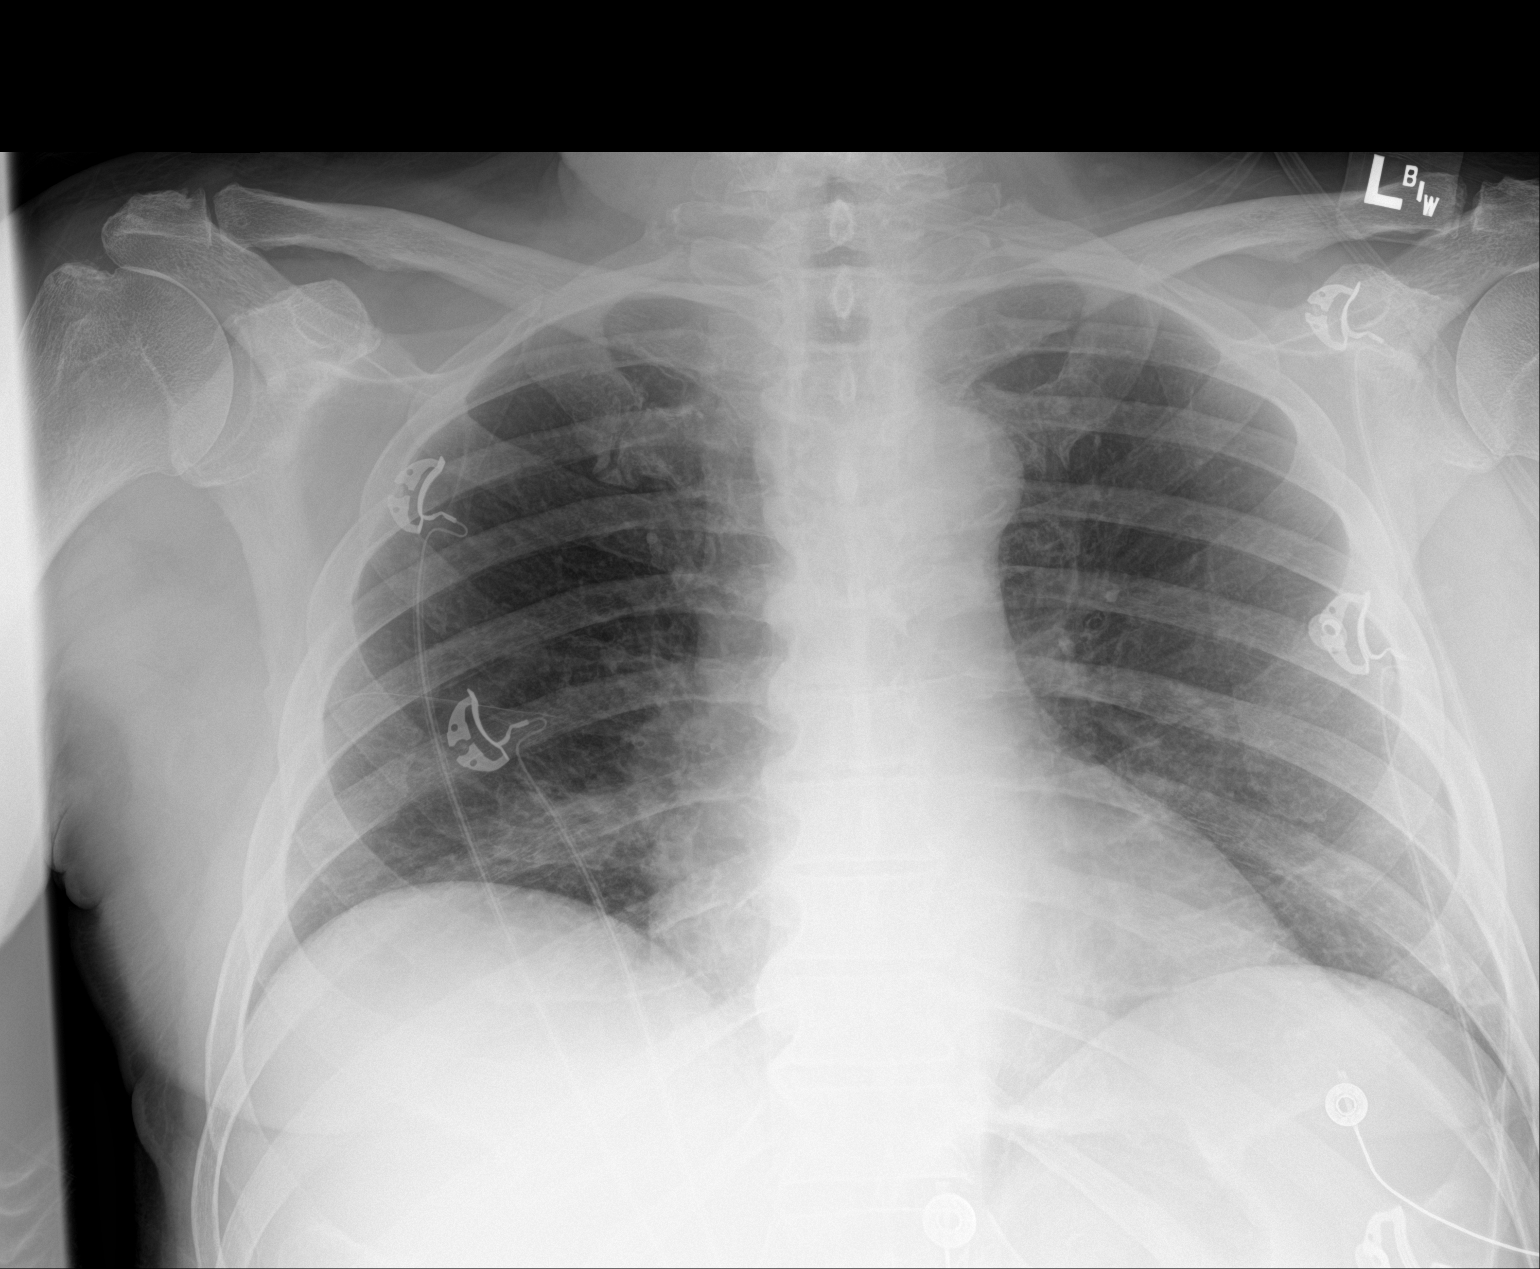

[1 of 1 positions shown; findings below may reference images not displayed]

FINDINGS: The heart size and mediastinal contours are within normal limits.
Both lungs are clear. The visualized skeletal structures are
unremarkable.
IMPRESSION: No active disease.

## 2017-02-17 MED ORDER — ONDANSETRON HCL 4 MG/2ML IJ SOLN: 4.0000 mg | Freq: Four times a day (QID) | INTRAMUSCULAR | Status: DC | PRN

## 2017-02-17 MED ORDER — ATROPINE SULFATE 1 MG/ML IJ SOLN
INTRAMUSCULAR | Status: DC | PRN
  Administered 2017-02-17: 1 mg via INTRAVENOUS

## 2017-02-17 MED ORDER — IBUPROFEN 200 MG PO TABS
200.0000 mg | ORAL_TABLET | ORAL | Status: DC | PRN
  Administered 2017-02-17: 200 mg via ORAL
  Filled 2017-02-17: qty 1

## 2017-02-17 MED ORDER — ASPIRIN EC 81 MG PO TBEC
81.0000 mg | DELAYED_RELEASE_TABLET | Freq: Every day | ORAL | Status: DC
  Administered 2017-02-18: 81 mg via ORAL
  Filled 2017-02-17: qty 1

## 2017-02-17 MED ORDER — ASPIRIN EC 81 MG PO TBEC: 81.0000 mg | DELAYED_RELEASE_TABLET | Freq: Every day | ORAL | Status: DC

## 2017-02-17 MED ORDER — HEPARIN SODIUM (PORCINE) 5000 UNIT/ML IJ SOLN
5000.0000 [IU] | Freq: Three times a day (TID) | INTRAMUSCULAR | Status: DC
  Administered 2017-02-17 – 2017-02-18 (×2): 5000 [IU] via SUBCUTANEOUS
  Filled 2017-02-17 (×2): qty 1

## 2017-02-17 MED ORDER — LOSARTAN POTASSIUM 50 MG PO TABS
50.0000 mg | ORAL_TABLET | Freq: Every day | ORAL | Status: DC
  Administered 2017-02-18: 50 mg via ORAL
  Filled 2017-02-17: qty 1

## 2017-02-17 MED ORDER — ACETAMINOPHEN 325 MG PO TABS: 650.0000 mg | ORAL_TABLET | ORAL | Status: DC | PRN

## 2017-02-17 MED ORDER — TAMSULOSIN HCL 0.4 MG PO CAPS
0.4000 mg | ORAL_CAPSULE | Freq: Every day | ORAL | Status: DC
  Administered 2017-02-17: 0.4 mg via ORAL
  Filled 2017-02-17: qty 1

## 2017-02-17 MED ORDER — SODIUM CHLORIDE 0.9 % IV BOLUS (SEPSIS)
250.0000 mL | Freq: Once | INTRAVENOUS | Status: AC
  Administered 2017-02-17: 250 mL via INTRAVENOUS

## 2017-02-17 MED ORDER — EPINEPHRINE PF 1 MG/ML IJ SOLN
0.5000 ug/min | INTRAVENOUS | Status: DC
  Administered 2017-02-17: 0.5 ug/min via INTRAVENOUS
  Filled 2017-02-17: qty 4

## 2017-02-17 NOTE — Code Documentation (Signed)
Pt c/o being nauseated, had an episode of dry heaves,  Heart rate dropped into 20's with asystole - atropine 1mg  IV given-- pulse came up to 80's. Dr. Ellender Hose at bedside,.

## 2017-02-17 NOTE — ED Triage Notes (Signed)
To ED via [facility] from home with c/o sudden onset nausea/vomiting/ bradycardia. Pt took first dose of XaNAFLEX THIS AM.

## 2017-02-17 NOTE — ED Provider Notes (Signed)
[facility] DEPT Provider Note   CSN: [id number] Arrival date & time: 02/17/17  1302     History   Chief Complaint Chief Complaint  Patient presents with  . Allergic Reaction  . Bradycardia  . Nausea    HPI Corey [name] is a 83 y.o. male.  HPI   83 yo M with PMHx HTN here with diaphoresis and weakness. Per report from EMS, pt was in his usual state of health until several hours ago. Pt was recently prescribed zanaflex for his shoulder pain. He took it several hours ago and went walking for 1 hour without difficulty. Approx 2 hours after taking his med, he began to develop nausea, fatigue. He reportedly had several episodes in which he passed out at home. EMS was called and he was brought to the ED. Per EMS, pt had intermittent changes in his QRS axis/rhythm en route, b ut no ST elevations,. He has been nauseous and diaphoretic en route.  On arrival, pt claims he "feels sick." He endorses nausea but no chest pain. No abdominal pain. All of these sx started after taking Zanaflex. Denies cardiac history.  Past Medical History:  Diagnosis Date  . Allergic asthma 1957   "had to leave [place]"  . Anxiety   . Arthritis    "neck, hips" (02/17/2017)  . Chronic cervical pain   . Chronic hip pain    "both hips"  . Chronic lower back pain   . Daily headache    "fairly constant; caused by spurs in my neck" (02/17/2017)  . GERD (gastroesophageal reflux disease)   . Hypertension   . Prostatitis   . Spinal stenosis     Patient Active Problem List   Diagnosis Date Noted  . Complete heart block ([employer]) 02/17/2017    Past Surgical History:  Procedure Laterality Date  . MELANOMA EXCISION     "pre-melanoma taken off my back"  . TONSILLECTOMY         Home Medications    Prior to Admission medications   Medication Sig Start Date End Date Taking? Authorizing Provider  aspirin EC 81 MG tablet Take 81 mg by mouth daily.   Yes [provider]  Glucosamine HCl  (GLUCOSAMINE PO) Take 750 mg by mouth 2 (two) times daily.    Yes [provider]  LORazepam (ATIVAN) 0.5 MG tablet Take 0.5 mg by mouth every 8 (eight) hours as needed for anxiety.   Yes [provider]  losartan (COZAAR) 50 MG tablet Take 50 mg by mouth daily.   Yes [provider]  meloxicam (MOBIC) 15 MG tablet Take 15 mg by mouth daily.   Yes [provider]  POTASSIUM PO Take 500 mg by mouth daily after lunch.   Yes [provider]  tamsulosin (FLOMAX) 0.4 MG CAPS capsule Take 0.4 mg by mouth daily. 07/30/15  Yes [provider]  Turmeric 500 MG TABS Take 500 mg by mouth daily.   Yes [provider]  vitamin C (ASCORBIC ACID) 500 MG tablet Take 500 mg by mouth 2 (two) times daily.   Yes [provider]  oxyCODONE-acetaminophen (PERCOCET/ROXICET) 5-325 MG tablet Take 1 tablet by mouth every 6 (six) hours as needed for severe pain. Patient not taking: Reported on 02/17/2017 11/04/15   Horton, Barbette Hair, MD    Family History Family History  Problem Relation Age of Onset  . Hypertension Father     Social History Social History  Substance Use Topics  . Smoking  status: Former Smoker    Years: 2.00    Types: Cigars    Quit date: 1963  . Smokeless tobacco: Never Used  . Alcohol use No     Allergies   Zanaflex [tizanidine hcl]; Ciprofloxacin; Penicillins; and Sulfa antibiotics   Review of Systems Review of Systems  Constitutional: Positive for diaphoresis and fatigue.  Gastrointestinal: Positive for nausea and vomiting.  Neurological: Positive for weakness and light-headedness.  All other systems reviewed and are negative.    Physical Exam Updated Vital Signs BP (!) 162/78 (BP Location: Left Arm)   Pulse 63   Temp 98.1 F (36.7 C) (Oral)   Resp 11   Ht 5\' 7"  (1.702 m)   Wt 74 kg (163 lb 1.6 oz)   SpO2 96%   BMI 25.55 kg/m   Physical Exam  Constitutional: He is oriented to person, place, and  time. He appears well-developed and well-nourished. He appears distressed.  HENT:  Head: Normocephalic and atraumatic.  Dried vomitus around mouth  Eyes: Conjunctivae are normal.  Neck: Neck supple.  Cardiovascular: Regular rhythm and normal heart sounds.  Bradycardia present.  Exam reveals no friction rub.   No murmur heard. Pulmonary/Chest: Effort normal and breath sounds normal. No respiratory distress. He has no wheezes. He has no rales.  Abdominal: He exhibits no distension.  Musculoskeletal: He exhibits no edema.  Neurological: He is alert and oriented to person, place, and time. He exhibits normal muscle tone.  Skin: Skin is warm. Capillary refill takes less than 2 seconds. He is diaphoretic.  Psychiatric: He has a normal mood and affect.  Nursing note and vitals reviewed.    ED Treatments / Results  Labs (all labs ordered are listed, but only abnormal results are displayed) Labs Reviewed  BASIC METABOLIC PANEL - Abnormal; Notable for the following:       Result Value   Glucose, Bld 185 (*)    Calcium 8.4 (*)    All other components within normal limits  CBC - Abnormal; Notable for the following:    WBC 10.9 (*)    HCT 37.8 (*)    Platelets 124 (*)    All other components within normal limits  LIPID PANEL - Abnormal; Notable for the following:    LDL Cholesterol 125 (*)    All other components within normal limits  BASIC METABOLIC PANEL - Abnormal; Notable for the following:    Glucose, Bld 122 (*)    Calcium 8.7 (*)    All other components within normal limits  CBC - Abnormal; Notable for the following:    HCT 38.5 (*)    All other components within normal limits  LIPID PANEL - Abnormal; Notable for the following:    LDL Cholesterol 128 (*)    All other components within normal limits  I-STAT CHEM 8, ED - Abnormal; Notable for the following:    Glucose, Bld 184 (*)    Calcium, Ion 1.14 (*)    Hemoglobin 12.6 (*)    HCT 37.0 (*)    All other components within  normal limits  MRSA PCR SCREENING  MAGNESIUM  PHOSPHORUS  BRAIN NATRIURETIC PEPTIDE  TROPONIN I  TROPONIN I  TROPONIN I  TSH  I-STAT TROPONIN, ED    EKG  EKG Interpretation  Date/Time:  Wednesday February 17 2017 13:10:05 EDT Ventricular Rate:  53 PR Interval:    QRS Duration: 151 QT Interval:  484 QTC Calculation: 455 R Axis:   30 Text Interpretation:  Sinus  rhythm Atrial premature complex Borderline prolonged PR interval Right bundle branch block No old tracing to compare Confirmed by Duffy Bruce [phone]) on 02/17/2017 2:29:51 PM       Radiology Dg Chest Portable 1 View  Result Date: 02/17/2017 CLINICAL DATA:  Arrhythmia EXAM: PORTABLE CHEST 1 VIEW COMPARISON:  None. FINDINGS: The heart size and mediastinal contours are within normal limits. Both lungs are clear. The visualized skeletal structures are unremarkable. IMPRESSION: No active disease. Electronically Signed   By: Inez Catalina M.D.   On: 02/17/2017 13:54    Procedures .Critical Care Performed by: Duffy Bruce Authorized by: Duffy Bruce     CRITICAL CARE Performed by: Evonnie Pat   Total critical care time: 35 minutes  Critical care time was exclusive of separately billable procedures and treating other patients.  Critical care was necessary to treat or prevent imminent or life-threatening deterioration.  Critical care was time spent personally by me on the following activities: development of treatment plan with patient and/or surrogate as well as nursing, discussions with consultants, evaluation of patient's response to treatment, examination of patient, obtaining history from patient or surrogate, ordering and performing treatments and interventions, ordering and review of laboratory studies, ordering and review of radiographic studies, pulse oximetry and re-evaluation of patient's condition.    (including critical care time)  Medications Ordered in ED Medications  sodium chloride 0.9 %  bolus 250 mL (0 mLs Intravenous Stopped 02/17/17 1530)     Initial Impression / Assessment and Plan / ED Course  I have reviewed the triage vital signs and the nursing notes.  Pertinent labs & imaging results that were available during my care of the patient were reviewed by me and considered in my medical decision making (see chart for details).     83 yo M with PMHx HTN here with symptomatic bradycardia. On arrival, pt noted to go into third degree/complete heart block with decrease in HR to 15-20. He became acutely diaphoretic and near syncopal during this episode. Pt immediately given atropine with improvement in HR. Pacing pads in place. Lab work overall unremarkable. I suspect pt's sx are 2/2 possible anticholinergic effects of zanaflex with underlying heart block. Cardiology immediately paged and pt admitted to their service. Discussed diagnosis, plan of care with wife. Pt feels better after atropine, brief time on epi drip. Not requiring additional meds at this time. No signs of ischemia.  Final Clinical Impressions(s) / ED Diagnoses   Final diagnoses:  Complete heart block ([place])  Symptomatic bradycardia    New Prescriptions Discharge Medication List as of 02/18/2017 10:06 AM       Duffy Bruce, MD 02/18/17 1422

## 2017-02-17 NOTE — H&P (Signed)
Patient ID: Corey [name] MRN: [id number], DOB/AGE: [birth year]   Admit date: 02/17/2017   Primary Physician: Christain Sacramento, MD Primary Cardiologist: New (Dr. Ellyn Hack) Electrophysiologist: New (on Call EP MD to see)  Pt. Profile:  83 y/o male with h/o HTN but no prior cardiac history, presenting to the ED with transient CHB and asystole. Cardiology asked to admit, at the request of Dr. Ellender Hose, ED physician.   Problem List  Past Medical History:  Diagnosis Date  . Arthritis   . Hypertension     Past Surgical History:  Procedure Laterality Date  . TONSILLECTOMY       Allergies  Allergies  Allergen Reactions  . Zanaflex [Tizanidine Hcl] Other (See Comments)    Severe bradycardia   . Ciprofloxacin Itching and Rash  . Penicillins Itching and Rash    Has patient had a PCN reaction causing immediate rash, facial/tongue/throat swelling, SOB or lightheadedness with hypotension:yes Has patient had a PCN reaction causing severe rash involving mucus membranes or skin necrosis:no Has patient had a PCN reaction that required hospitalization:no Has patient had a PCN reaction occurring within the last 10 years:no If all of the above answers are "NO", then may proceed with Cephalosporin use.   . Sulfa Antibiotics Itching and Rash    HPI  83 y/o male with h/o HTN but no prior cardiac history, presenting to the ED with transient CHB and asystole. Cardiology asked to admit, at the request of Dr. Ellender Hose, ED physician.   He has no known h/o CAD. No prior cardiac history. He has HTN, treated with Losartan. No BB or CCBs. He denies any prior h/o DM, HLD or tobacco abuse. No family h/o CAD. No personal past h/o CP, dyspnea, palpitations nor syncope.  Most recently, he has been bothered by left sided shoulder pain c/w musculoskeletal pain. His wife reports he is very active and does a lot of weight lifting. He has had left shoulder pain with upper extremity activity and movement. He  was recently seen by an orthopedist and started on Zanaflex (Tizanidine), which is a muscle relaxer. He took his first dose earlier this morning. Afterwards, he and his wife went on there usual morning walk. He had no issues during his walk and no symptoms. When he returned home, she suddenly felt unwell. He felt dizzy, fatigued and light headed, but no CP. He laid down on the couch to rest, his wife then noticed that he started to scream out and was wrapping his arms around his chest and was convulsing. His wife called his orthopedist who prescribed the zanaflex and they told her to call EMS.  On EMS arrival, rhythm strips showed marked bradycardia with transient CHB and asystole ~4 sec. He was given atropine and transported to the ED. While in the ED, he had recurrent pauses requiring atropine and HR increased back to normal. He had vomiting in the ED. Pacer pads are in place and HR and rhythm currently stable. He denies any CP or dyspnea. Mentating well. BP is stable.   Labs shows WBC of 10.9. Hgb 12.6. SCr WNL at 0.93. K normal at 3.8. Mg 1.9. TSH pending. POC troponin in the ED was negative. No ST elevations/depressions on EKG. Home meds reviewed. He is on no AV nodal blocking agents, however it appears that Zanaflex (Tizanidine) can potentially cause severe bradycardia and syncope.     Home Medications  Prior to Admission medications   Medication Sig Start Date End Date Taking?  Authorizing Provider  aspirin EC 81 MG tablet Take 81 mg by mouth daily.    [provider]  cephALEXin (KEFLEX) 500 MG capsule Take 500 mg by mouth 3 (three) times daily. 10/23/15   [provider]  Glucosamine HCl (GLUCOSAMINE PO) Take 2 tablets by mouth 2 (two) times daily.    [provider]  losartan (COZAAR) 50 MG tablet Take 50 mg by mouth daily.    [provider]  meloxicam (MOBIC) 15 MG tablet Take 15 mg by mouth daily.    [provider]  oxyCODONE-acetaminophen  (PERCOCET/ROXICET) 5-325 MG tablet Take 1 tablet by mouth every 6 (six) hours as needed for severe pain. 11/04/15   Horton, Barbette Hair, MD  POTASSIUM PO Take 500 mg by mouth daily after lunch.    [provider]  tamsulosin (FLOMAX) 0.4 MG CAPS capsule Take 0.4 mg by mouth daily. 07/30/15   [provider]  Turmeric 500 MG TABS Take 500 mg by mouth daily.    [provider]  vitamin C (ASCORBIC ACID) 500 MG tablet Take 500 mg by mouth 2 (two) times daily.    [provider]    Family History  Family History  Problem Relation Age of Onset  . Hypertension Father     Social History  Social History   Social History  . Marital status: Married    Spouse name: N/A  . Number of children: N/A  . Years of education: N/A   Occupational History  . Not on file.   Social History Main Topics  . Smoking status: Never Smoker  . Smokeless tobacco: Never Used  . Alcohol use No  . Drug use: No  . Sexual activity: Not on file   Other Topics Concern  . Not on file   Social History Narrative  . No narrative on file     Review of Systems General:  No chills, fever, night sweats or weight changes.  Cardiovascular:  No chest pain, dyspnea on exertion, edema, orthopnea, palpitations, paroxysmal nocturnal dyspnea. + dizziness and syncope  Dermatological: No rash, lesions/masses Respiratory: No cough, dyspnea Urologic: No hematuria, dysuria Abdominal:   + for nausea/vomiting, no diarrhea, bright red blood per rectum, melena, or hematemesis Neurologic:  No visual changes, wkns, changes in mental status. All other systems reviewed and are otherwise negative except as noted above.  Physical Exam  Blood pressure (!) 160/70, pulse (!) 48, resp. rate 16, height 5\' 7"  (1.702 m), weight 161 lb (73 kg), SpO2 99 %.  General:  NAD but appears a bit nauseated, appears younger than age Psych: Normal affect. Neuro: Alert and oriented X 3. Moves all extremities  spontaneously. HEENT: Normal  Neck: Supple without bruits or JVD. Lungs:  Resp regular and unlabored, CTA. Heart: RRR no s3, s4, or murmurs. Abdomen: Soft, non-tender, non-distended, BS + x 4.  Extremities: No clubbing, cyanosis or edema. DP/PT/Radials 2+ and equal bilaterally.  Labs  Troponin [facility] Point of Care Test)  Recent Labs  02/17/17 1334  TROPIPOC 0.00   No results for input(s): CKTOTAL, CKMB, TROPONINI in the last 72 hours. Lab Results  Component Value Date   WBC 10.9 (H) 02/17/2017   HGB 12.6 (L) 02/17/2017   HCT 37.0 (L) 02/17/2017   MCV 82.9 02/17/2017   PLT 124 (L) 02/17/2017     Recent Labs Lab 02/17/17 1316 02/17/17 1335  NA 140 140  K 3.8 3.8  CL 108 106  CO2 24  --  BUN 18 20  CREATININE 0.93 0.90  CALCIUM 8.4*  --   GLUCOSE 185* 184*   No results found for: CHOL, HDL, LDLCALC, TRIG No results found for: DDIMER   Radiology/Studies  Dg Chest Portable 1 View  Result Date: 02/17/2017 CLINICAL DATA:  Arrhythmia EXAM: PORTABLE CHEST 1 VIEW COMPARISON:  None. FINDINGS: The heart size and mediastinal contours are within normal limits. Both lungs are clear. The visualized skeletal structures are unremarkable. IMPRESSION: No active disease. Electronically Signed   By: Inez Catalina M.D.   On: 02/17/2017 13:54    ECG  Transient bradycardia, CHB>> asystole ~4 sec on EMS strips -- personally reviewed  ED EKG personally reviewed:  Sinus rhythm, bradycardia 53 bpm, Atrial premature complex, Borderline prolonged PR interval, Right bundle branch block   Telemetry  Currently NSR-- personally reviewed   Echocardiogram Pending   ASSESSMENT AND PLAN  Active Problems:   Complete heart block ([place])  1. Complete Heart Block: Transient complete heart block>> asystole ~4 sec, requiring atropine. HR and rhythm currently stable on examination. Zoll pacer pads are in place. Pt mentating well. BP stable. No chest pain. POC troponin is negative. EKG w/o ST  elevations/ depressions. K and Mg both stable at 3.8 and 1.9, respectively. TSH pending. Home medications reviewed. He is not on a BB nor CCB, however he took  Zanaflex (Tizanidine), which is a muscle relaxer, for the first time today x 1 dose. Potential side effects include severe bradycardia and syncope. ? If this caused his arrhthymia. ? Underlying conduction disease. I've discussed plan with Dr. Ellyn Hack, DOD, who has also seen and examined the patient. We will plan to admit to stepdown. Continue telemetry monitoring and keep Zoll pacer pads on. Strict bed rest. Zanafelx has been discontinued and we will monitor during washout period. I have added this medication to his allergy list so that this will be avoided in the further. We will also cycle cardiac enzymes, check 2D echo to assess LVF and screens for structural abnormalities as well as obtain thyroid function test. EP to also see today to determine need for PPM vs outpatient cardiac monitoring. Zofran ordered, PRN, for nausea. We will also give a fluid bolus of NaCl. Monitor closely.    Signed, Lyda Jester, PA-C, M[facility] 02/17/2017, 2:39 PM [facility] Pager: [phone]

## 2017-02-17 NOTE — Code Documentation (Signed)
Pt c/o nausea/became bradycardic-- heart rate dropped to 20's then became asystolic-- atropine given, dr Ellender Hose at bedside.

## 2017-02-18 ENCOUNTER — Observation Stay (HOSPITAL_COMMUNITY): Payer: Medicare Other

## 2017-02-18 DIAGNOSIS — Z87891 Personal history of nicotine dependence: Secondary | ICD-10-CM | POA: Diagnosis not present

## 2017-02-18 DIAGNOSIS — T428X5A Adverse effect of antiparkinsonism drugs and other central muscle-tone depressants, initial encounter: Secondary | ICD-10-CM | POA: Diagnosis not present

## 2017-02-18 DIAGNOSIS — R110 Nausea: Secondary | ICD-10-CM | POA: Diagnosis not present

## 2017-02-18 DIAGNOSIS — I10 Essential (primary) hypertension: Secondary | ICD-10-CM

## 2017-02-18 DIAGNOSIS — Z79899 Other long term (current) drug therapy: Secondary | ICD-10-CM | POA: Diagnosis not present

## 2017-02-18 DIAGNOSIS — Z7982 Long term (current) use of aspirin: Secondary | ICD-10-CM | POA: Diagnosis not present

## 2017-02-18 DIAGNOSIS — I442 Atrioventricular block, complete: Secondary | ICD-10-CM | POA: Diagnosis not present

## 2017-02-18 DIAGNOSIS — J45909 Unspecified asthma, uncomplicated: Secondary | ICD-10-CM | POA: Diagnosis not present

## 2017-02-18 LAB — BASIC METABOLIC PANEL
Anion gap: 8 (ref 5–15)
BUN: 14 mg/dL (ref 6–20)
CALCIUM: 8.7 mg/dL — AB (ref 8.9–10.3)
CO2: 25 mmol/L (ref 22–32)
CREATININE: 0.99 mg/dL (ref 0.61–1.24)
Chloride: 104 mmol/L (ref 101–111)
GFR calc Af Amer: >60 mL/min (ref >60)
GFR calc non Af Amer: >60 mL/min (ref >60)
GLUCOSE: 122 mg/dL — AB (ref 65–99)
Potassium: 3.8 mmol/L (ref 3.5–5.1)
Sodium: 137 mmol/L (ref 135–145)

## 2017-02-18 LAB — MRSA PCR SCREENING: MRSA BY PCR: NEGATIVE

## 2017-02-18 LAB — CBC
HCT: 38.5 % — ABNORMAL LOW (ref 39.0–52.0)
Hemoglobin: 13.4 g/dL (ref 13.0–17.0)
MCH: 28.8 pg (ref 26.0–34.0)
MCHC: 34.8 g/dL (ref 30.0–36.0)
MCV: 82.6 fL (ref 78.0–100.0)
PLATELETS: 165 10*3/uL (ref 150–400)
RBC: 4.66 MIL/uL (ref 4.22–5.81)
RDW: 13.3 % (ref 11.5–15.5)
WBC: 9.4 10*3/uL (ref 4.0–10.5)

## 2017-02-18 LAB — LIPID PANEL
CHOL/HDL RATIO: 4 ratio
CHOL/HDL RATIO: 3.9 ratio
CHOLESTEROL: 185 mg/dL (ref 0–200)
Cholesterol: 186 mg/dL (ref 0–200)
HDL: 46 mg/dL (ref >40)
HDL: 47 mg/dL (ref >40)
LDL CALC: 128 mg/dL — AB (ref 0–99)
LDL Cholesterol: 125 mg/dL — ABNORMAL HIGH (ref 0–99)
TRIGLYCERIDES: 63 mg/dL (ref <150)
Triglycerides: 59 mg/dL (ref <150)
VLDL: 12 mg/dL (ref 0–40)
VLDL: 13 mg/dL (ref 0–40)

## 2017-02-18 LAB — TROPONIN I: Troponin I: <0.03 ng/mL (ref <0.03)

## 2017-02-18 MED FILL — Medication: Qty: 1 | Status: AC

## 2017-02-18 NOTE — Discharge Instructions (Signed)
Increase activity slowly Do not take any more Zanaflex Call the office with symptoms of dizziness/passing out.

## 2017-02-18 NOTE — Progress Notes (Signed)
Pt discharged to home via w/c, education complete, accompanied by family members.  Edward Qualia RN

## 2017-02-18 NOTE — Discharge Summary (Signed)
ELECTROPHYSIOLOGY PROCEDURE DISCHARGE SUMMARY    Patient ID: Corey [name],  MRN: [id number], DOB/AGE: [birth year] 83 y.o.  Admit date: 02/17/2017 Discharge date: 02/18/2017  Primary Care Physician: Christain Sacramento, MD Electrophysiologist: Martinez Boxx  Primary Discharge Diagnosis:  Complete heart block in the setting of Zanaflex use  Secondary Discharge Diagnosis: 1.  HTN 2.  Arthritis 3.  Spinal stenosis   Allergies  Allergen Reactions  . Zanaflex [Tizanidine Hcl] Other (See Comments)    Severe bradycardia   . Ciprofloxacin Itching and Rash  . Penicillins Itching and Rash    Has patient had a PCN reaction causing immediate rash, facial/tongue/throat swelling, SOB or lightheadedness with hypotension:yes Has patient had a PCN reaction causing severe rash involving mucus membranes or skin necrosis:no Has patient had a PCN reaction that required hospitalization:no Has patient had a PCN reaction occurring within the last 10 years:no If all of the above answers are "NO", then may proceed with Cephalosporin use.   . Sulfa Antibiotics Itching and Rash    Brief HPI/Hospital Course:  Corey [name] is a 83 y.o. male witha past medical history significant for arthritis, hypertension, GERD, and spinal stenosis. He has been having shoulder discomfort and saw his orthopedic MD who started Zanaflex. He took his first dose the morning of admission and then went for his usual walk with his wife. He got back home and was taking off his shoes when he began to feel dizzy and sat down on the couch. Per his wife, he then lost consciousness. EMS was called and he was brought to [facility] where he had a similar episode in the ER with complete heart block demonstrated on telemetry. He was monitored on telemetry overnight which demonstrated sinus rhythm with no bradycardia or heart block. He was seen by Dr Rayann Heman who felt that with reversible cause of heart block identified, pacemaker implantation was  not indicated.  He has no symptoms of dizziness, pre-syncope, or syncope prior to Zanaflex dosing. He has been advised to increase activity slowly, avoid Zanaflex, and call the office with symptoms of recurrent bradycardia.  He will have close outpatient follow up.   Physical Exam: Vitals:   02/17/17 2304 02/17/17 2332 02/18/17 0320 02/18/17 0720  BP: (!) 168/79 (!) 155/75 (!) 156/68 (!) 162/78  Pulse: 77  61 63  Resp: 14  13 11   Temp:   97.9 F (36.6 C) 98.1 F (36.7 C)  TempSrc:   Oral Oral  SpO2: 97%  97% 96%  Weight:   163 lb 1.6 oz (74 kg)   Height:        GEN- The patient is well appearing, alert and oriented x 3 today.   HEENT: normocephalic, atraumatic; sclera clear, conjunctiva pink; hearing intact; oropharynx clear; neck supple  Lungs- Clear to ausculation bilaterally, normal work of breathing.  No wheezes, rales, rhonchi Heart- Regular rate and rhythm  GI- soft, non-tender, non-distended, bowel sounds present  Extremities- no clubbing, cyanosis, or edema  MS- no significant deformity or atrophy Skin- warm and dry, no rash or lesion Psych- euthymic mood, full affect Neuro- strength and sensation are intact    Labs:   Lab Results  Component Value Date   WBC 9.4 02/18/2017   HGB 13.4 02/18/2017   HCT 38.5 (L) 02/18/2017   MCV 82.6 02/18/2017   PLT 165 02/18/2017     Recent Labs Lab 02/18/17 0207  NA 137  K 3.8  CL 104  CO2 25  BUN 14  CREATININE 0.99  CALCIUM 8.7*  GLUCOSE 122*     Discharge Medications:  Current Discharge Medication List    CONTINUE these medications which have NOT CHANGED   Details  aspirin EC 81 MG tablet Take 81 mg by mouth daily.    Glucosamine HCl (GLUCOSAMINE PO) Take 750 mg by mouth 2 (two) times daily.     LORazepam (ATIVAN) 0.5 MG tablet Take 0.5 mg by mouth every 8 (eight) hours as needed for anxiety.    losartan (COZAAR) 50 MG tablet Take 50 mg by mouth daily.    meloxicam (MOBIC) 15 MG tablet Take 15 mg by mouth  daily.    POTASSIUM PO Take 500 mg by mouth daily after lunch.    tamsulosin (FLOMAX) 0.4 MG CAPS capsule Take 0.4 mg by mouth daily.    Turmeric 500 MG TABS Take 500 mg by mouth daily.    vitamin C (ASCORBIC ACID) 500 MG tablet Take 500 mg by mouth 2 (two) times daily.    oxyCODONE-acetaminophen (PERCOCET/ROXICET) 5-325 MG tablet Take 1 tablet by mouth every 6 (six) hours as needed for severe pain. Qty: 15 tablet, Refills: 0        Disposition: Pt is being discharged home today in good condition. Discharge Instructions    Diet - low sodium heart healthy    Complete by:  As directed    Increase activity slowly    Complete by:  As directed      Follow-up Information    Corey Berthold, NP Follow up on 02/25/2017.   Specialty:  Cardiology Why:  at 12:20PM  Contact information: [place] [phone]           Duration of Discharge Encounter: Greater than 30 minutes including physician time.  Signed, Chanetta Marshall, NP 02/18/2017 8:25 AM  I have seen, examined the patient, and reviewed the above assessment and plan.  On exam, RRR. Changes to above are made where necessary.  Pt with extreme bradycardia and syncope in the setting of zanaflex.  This is a known reaction from this medicine.  He has been observed on telemetry without any further episodes.  Denies prior syncope. Given reversible cause, no indication for pacing at this time. No driving Discharge with close outpatient follow-up  Co Sign: Thompson Grayer, MD 02/18/2017 10:59 AM

## 2017-02-18 NOTE — Progress Notes (Signed)
   02/18/17 0941  Clinical Encounter Type  Visited With Patient and family together  Visit Type Initial;Social support;Spiritual support   Chaplain introduced herself to family while rounding.  Patient sitting in bed eating a banana.  Patient is happy to be going home today.  Family in room with patient and expressing appreciation for Chaplain presence.  Chaplain would like to thank the medical staff for their care for this patient.  Chaplain available as needed.  Thank you.

## 2017-02-18 NOTE — Consult Note (Signed)
ELECTROPHYSIOLOGY CONSULT NOTE    Patient ID: Corey [name] MRN: [id number], DOB/AGE: Jun 09, 1932 83 y.o.  Admit date: 02/17/2017 Date of Consult: 02/18/2017  Primary Physician: Christain Sacramento, MD Primary Cardiologist: Ellyn Hack (new this admission)  Reason for Consultation: complete heart block  HPI:  Corey [name] is a 83 y.o. male is referred by Dr Ellyn Hack for evaluation of complete heart block.  Past medical history is significant for arthritis, hypertension, GERD, and spinal stenosis. He has been having shoulder discomfort and saw his orthopedic MD who started Zanaflex. He took his first dose yesterday morning and then went for his usual walk with his wife. He got back home and was taking off his shoes when he began to feel dizzy and sat down on the couch. Per his wife, he then lost consciousness. EMS was called and he was brought to [facility] where he had a similar episode in the ER with complete heart block demonstrated on telemetry.  He has been monitored overnight with no further heart block or bradycardia. EP has been asked to evaluate for treatment options.  He is very active at home and does not have chest pain or shortness of breath. He has occasional orthostatic intolerance but does not have any other symptoms of dizziness, pre-syncope, or syncope. No recent fevers, chills, nausea or vomiting.  He is on no other AVN blocking agents at home.   Past Medical History:  Diagnosis Date  . Allergic asthma 1957   "had to leave [place]"  . Anxiety   . Arthritis    "neck, hips" (02/17/2017)  . Chronic cervical pain   . Chronic hip pain    "both hips"  . Chronic lower back pain   . Daily headache    "fairly constant; caused by spurs in my neck" (02/17/2017)  . GERD (gastroesophageal reflux disease)   . Hypertension   . Prostatitis   . Spinal stenosis      Surgical History:  Past Surgical History:  Procedure Laterality Date  . MELANOMA EXCISION     "pre-melanoma taken off  my back"  . TONSILLECTOMY       Prescriptions Prior to Admission  Medication Sig Dispense Refill Last Dose  . aspirin EC 81 MG tablet Take 81 mg by mouth daily.   02/16/2017 at Unknown time  . Glucosamine HCl (GLUCOSAMINE PO) Take 750 mg by mouth 2 (two) times daily.    02/17/2017 at Unknown time  . LORazepam (ATIVAN) 0.5 MG tablet Take 0.5 mg by mouth every 8 (eight) hours as needed for anxiety.   Past Week at Unknown time  . losartan (COZAAR) 50 MG tablet Take 50 mg by mouth daily.   02/17/2017 at Unknown time  . meloxicam (MOBIC) 15 MG tablet Take 15 mg by mouth daily.   02/16/2017 at Unknown time  . POTASSIUM PO Take 500 mg by mouth daily after lunch.   02/17/2017 at Unknown time  . tamsulosin (FLOMAX) 0.4 MG CAPS capsule Take 0.4 mg by mouth daily.   02/16/2017 at Unknown time  . Turmeric 500 MG TABS Take 500 mg by mouth daily.   02/16/2017 at Unknown time  . vitamin C (ASCORBIC ACID) 500 MG tablet Take 500 mg by mouth 2 (two) times daily.   02/17/2017 at Unknown time  . oxyCODONE-acetaminophen (PERCOCET/ROXICET) 5-325 MG tablet Take 1 tablet by mouth every 6 (six) hours as needed for severe pain. (Patient not taking: Reported on 02/17/2017) 15 tablet 0 Not Taking at Unknown time  Inpatient Medications: . aspirin EC  81 mg Oral Daily  . heparin  5,000 Units Subcutaneous Q8H  . losartan  50 mg Oral Daily  . tamsulosin  0.4 mg Oral QPC supper    Allergies:  Allergies  Allergen Reactions  . Zanaflex [Tizanidine Hcl] Other (See Comments)    Severe bradycardia   . Ciprofloxacin Itching and Rash  . Penicillins Itching and Rash    Has patient had a PCN reaction causing immediate rash, facial/tongue/throat swelling, SOB or lightheadedness with hypotension:yes Has patient had a PCN reaction causing severe rash involving mucus membranes or skin necrosis:no Has patient had a PCN reaction that required hospitalization:no Has patient had a PCN reaction occurring within the last 10 years:no If all  of the above answers are "NO", then may proceed with Cephalosporin use.   . Sulfa Antibiotics Itching and Rash    Social History   Social History  . Marital status: Married    Spouse name: N/A  . Number of children: N/A  . Years of education: N/A   Occupational History  . Not on file.   Social History Main Topics  . Smoking status: Former Smoker    Years: 2.00    Types: Cigars    Quit date: 1963  . Smokeless tobacco: Never Used  . Alcohol use No  . Drug use: No  . Sexual activity: Yes   Other Topics Concern  . Not on file   Social History Narrative  . No narrative on file     Family History  Problem Relation Age of Onset  . Hypertension Father      Review of Systems: All other systems reviewed and are otherwise negative except as noted above.  Physical Exam: Vitals:   02/17/17 2304 02/17/17 2332 02/18/17 0320 02/18/17 0720  BP: (!) 168/79 (!) 155/75 (!) 156/68 (!) 162/78  Pulse: 77  61 63  Resp: 14  13 11   Temp:   97.9 F (36.6 C) 98.1 F (36.7 C)  TempSrc:   Oral Oral  SpO2: 97%  97% 96%  Weight:   163 lb 1.6 oz (74 kg)   Height:        GEN- The patient is well appearing, alert and oriented x 3 today.   HEENT: normocephalic, atraumatic; sclera clear, conjunctiva pink; hearing intact; oropharynx clear; neck supple  Lungs- Clear to ausculation bilaterally, normal work of breathing.  No wheezes, rales, rhonchi Heart- Regular rate and rhythm  GI- soft, non-tender, non-distended, bowel sounds present  Extremities- no clubbing, cyanosis, or edema  MS- no significant deformity or atrophy Skin- warm and dry, no rash or lesion Psych- euthymic mood, full affect Neuro- strength and sensation are intact  Labs:  Lab Results  Component Value Date   WBC 9.4 02/18/2017   HGB 13.4 02/18/2017   HCT 38.5 (L) 02/18/2017   MCV 82.6 02/18/2017   PLT 165 02/18/2017    Recent Labs Lab 02/18/17 0207  NA 137  K 3.8  CL 104  CO2 25  BUN 14  CREATININE 0.99    CALCIUM 8.7*  GLUCOSE 122*      Radiology/Studies: Dg Chest Portable 1 View  Result Date: 02/17/2017 CLINICAL DATA:  Arrhythmia EXAM: PORTABLE CHEST 1 VIEW COMPARISON:  None. FINDINGS: The heart size and mediastinal contours are within normal limits. Both lungs are clear. The visualized skeletal structures are unremarkable. IMPRESSION: No active disease. Electronically Signed   By: Inez Catalina M.D.   On: 02/17/2017 13:54  KKD:PTELM rhythm, RBBB (personally reviewed)  TELEMETRY: sinus rhythm since leaving the ER (personally reviewed)  Assessment/Plan: 1.  Complete heart block and syncope Occurred in the setting of Zanaflex use which can cause bradycardia He has never had dizziness, pre-syncope, or syncope in the past outside of stable orthostatic intolerance He has had no recurrent heart block or bradycardia overnight He does have baseline RBBB and has been advised to call with further symptoms of bradycardia. He does not currently have an indication for pacemaker implantation with reversible cause of heart block Ok to discharge home with close outpatient follow up  2.  HTN Stable No change required today   Signed, Chanetta Marshall, NP 02/18/2017 8:09 AM  I have seen, examined the patient, and reviewed the above assessment and plan.  See EP consult note. Changes to above are made where necessary.  Close outpatient follow-up is planned.  Co Sign: Thompson Grayer, MD 02/18/2017 11:03 AM

## 2017-02-20 DIAGNOSIS — M25512 Pain in left shoulder: Secondary | ICD-10-CM | POA: Diagnosis not present

## 2017-02-23 NOTE — Progress Notes (Addendum)
Electrophysiology Office Note Date: 02/25/2017  ID:  Corey [name], DOB 1931/08/11, MRN [id number]  PCP: Christain Sacramento, MD Electrophysiologist: Rayann Heman  CC: hospitalization follow up  Corey [name] is a 83 y.o. male seen today for Dr Rayann Heman.  He presents today for routine post hospital followup. He was recently admitted after being started on Zanaflex. He then developed syncope with symptomatic complete heart block. Zanaflex was discontinued and he has had no recurrent symptoms since discharge. He denies chest pain, palpitations, dyspnea, PND, orthopnea, nausea, vomiting, dizziness, syncope, edema, weight gain, or early satiety. He is anxious about his recent admission and is concerned about recurrence.  BP at home has been checked daily with average BP 140/80's.  Prior to recent admission, BP was in the 120 range. He has been taking extra Lorazepam to help with anxiety. He has not been exercising as much as he previously was 2/2 concerns about recurrent bradycardia.   Past Medical History:  Diagnosis Date  . Allergic asthma 1957   "had to leave [place]"  . Anxiety   . Arthritis    "neck, hips" (02/17/2017)  . Chronic cervical pain   . Chronic hip pain    "both hips"  . Chronic lower back pain   . Daily headache    "fairly constant; caused by spurs in my neck" (02/17/2017)  . GERD (gastroesophageal reflux disease)   . Hypertension   . Prostatitis   . Spinal stenosis    Past Surgical History:  Procedure Laterality Date  . MELANOMA EXCISION     "pre-melanoma taken off my back"  . TONSILLECTOMY      Current Outpatient Prescriptions  Medication Sig Dispense Refill  . aspirin EC 81 MG tablet Take 81 mg by mouth daily.    . Glucosamine HCl (GLUCOSAMINE PO) Take 750 mg by mouth 2 (two) times daily.     Marland Kitchen LORazepam (ATIVAN) 0.5 MG tablet Take 0.5 mg by mouth every 8 (eight) hours as needed for anxiety.    Marland Kitchen losartan (COZAAR) 50 MG tablet Take 50 mg by mouth daily.    .  meloxicam (MOBIC) 15 MG tablet Take 15 mg by mouth daily.    Marland Kitchen POTASSIUM PO Take 500 mg by mouth daily after lunch.    . tamsulosin (FLOMAX) 0.4 MG CAPS capsule Take 0.4 mg by mouth daily.    . Turmeric 500 MG TABS Take 500 mg by mouth daily.    . vitamin C (ASCORBIC ACID) 500 MG tablet Take 500 mg by mouth 2 (two) times daily.     No current facility-administered medications for this visit.     Allergies:   Zanaflex [tizanidine hcl]; Ciprofloxacin; Penicillins; and Sulfa antibiotics   Social History: Social History   Social History  . Marital status: Married    Spouse name: N/A  . Number of children: N/A  . Years of education: N/A   Occupational History  . Not on file.   Social History Main Topics  . Smoking status: Former Smoker    Years: 2.00    Types: Cigars    Quit date: 1963  . Smokeless tobacco: Never Used  . Alcohol use No  . Drug use: No  . Sexual activity: Yes   Other Topics Concern  . Not on file   Social History Narrative  . No narrative on file    Family History: Family History  Problem Relation Age of Onset  . Hypertension Father     Review  of Systems: All other systems reviewed and are otherwise negative except as noted above.   Physical Exam: VS:  BP (!) 168/86   Pulse 64   Ht 5\' 8"  (1.727 m)   Wt 161 lb 12.8 oz (73.4 kg)   BMI 24.60 kg/m  , BMI Body mass index is 24.6 kg/m. Wt Readings from Last 3 Encounters:  02/25/17 161 lb 12.8 oz (73.4 kg)  02/18/17 163 lb 1.6 oz (74 kg)    GEN- The patient is elderly appearing, alert and oriented x 3 today.   HEENT: normocephalic, atraumatic; sclera clear, conjunctiva pink; hearing intact; oropharynx clear; neck supple Lungs- Clear to ausculation bilaterally, normal work of breathing.  No wheezes, rales, rhonchi Heart- Regular rate and rhythm GI- soft, non-tender, non-distended, bowel sounds present Extremities- no clubbing, cyanosis, or edema MS- no significant deformity or atrophy Skin-  warm and dry, no rash or lesion  Psych- euthymic mood, full affect Neuro- strength and sensation are intact   EKG:  EKG is ordered today. The ekg ordered today shows sinus rhythm, RBBB  Recent Labs: 02/17/2017: B Natriuretic Peptide 28.6; Magnesium 1.9; TSH 0.629 02/18/2017: BUN 14; Creatinine, Ser 0.99; Hemoglobin 13.4; Platelets 165; Potassium 3.8; Sodium 137    Other studies Reviewed: Additional studies/ records that were reviewed today include: hospital records  Assessment and Plan:  1.  Complete heart block in the setting of Zanaflex use No recurrence since discontinuing Zanaflex Avoid further use He is reminded of symptoms of heart block that would warrant further attention  2.  HTN BP elevated in office today. Pt has a longstanding history of white coat hypertension. He has a thorough blood pressure log for the last few weeks which shows BP on average at home 433-295 systolic.  We discussed increasing losartan today; however, he feels that elevated BP at home is due to anxiety about recurrent heart block.  We discussed increasing activity as tolerated, keeping continued log of blood pressure and he will follow up with PCP next week.  Avoid AVN blocking agents  3.  Anxiety As above I have reassured him today I did offer event monitor if he thought it would help with anxiety levels which he declined. He felt that the EKG being reassuring today would help him get back to normal activities   Current medicines are reviewed at length with the patient today.   The patient does not have concerns regarding his medicines.  The following changes were made today:  none  Labs/ tests ordered today include: none No orders of the defined types were placed in this encounter.    Disposition:   Follow up with me in 4-6 weeks    Signed, Chanetta Marshall, NP 02/25/2017 8:32 PM   [place] [address] [phone]  (office) [phone] (fax)

## 2017-02-24 DIAGNOSIS — M67912 Unspecified disorder of synovium and tendon, left shoulder: Secondary | ICD-10-CM | POA: Diagnosis not present

## 2017-02-25 ENCOUNTER — Ambulatory Visit (INDEPENDENT_AMBULATORY_CARE_PROVIDER_SITE_OTHER): Payer: Medicare Other | Admitting: Nurse Practitioner

## 2017-02-25 ENCOUNTER — Encounter: Payer: Self-pay | Admitting: Nurse Practitioner

## 2017-02-25 VITALS — BP 168/86 | HR 64 | Ht 68.0 in | Wt 161.8 lb

## 2017-02-25 DIAGNOSIS — I442 Atrioventricular block, complete: Secondary | ICD-10-CM

## 2017-02-25 DIAGNOSIS — I10 Essential (primary) hypertension: Secondary | ICD-10-CM

## 2017-02-25 NOTE — Patient Instructions (Signed)
Medication Instructions:  You can take over the counter flonase and claritin.   Labwork: None Ordered  Testing/Procedures: None Ordered   Follow-Up: Your physician recommends that you schedule a follow-up appointment in: 4-6 weeks with Chanetta Marshall, NP   Any Other Special Instructions Will Be Listed Below (If Applicable).     If you need a refill on your cardiac medications before your next appointment, please call your pharmacy.

## 2017-04-06 ENCOUNTER — Telehealth: Payer: Self-pay

## 2017-04-09 ENCOUNTER — Ambulatory Visit: Payer: Medicare Other | Admitting: Nurse Practitioner

## 2017-04-14 NOTE — Progress Notes (Signed)
Cardiology Office Note Date:  04/16/2017  Patient ID:  Corey [name], DOB 18-Nov-1931, MRN [id number] PCP:  Christain Sacramento, MD  Cardiologist: Dr. Ellyn Hack (in-patient) Electrophysiologist; Dr. Rayann Heman    Chief Complaint: planned f/u  History of Present Illness: Corey [name] is a 83 y.o. male with history of CBP (known spinal stenosis) and chronic hip pain, GERD, HTN, anxiety, most recently developed CHB after being started on Zanaflex resulting in syncope.  He was admitted and observed off the drug without recurrent heart block.  He comes in today to be seen for Dr. Rayann Heman.  He was last seen by A. Lynnell Jude, NP at is post hospital visit, he had not had any recurrent symptoms, though remained quite anxious about the possibility and had scaled back significantly his activities.  EKG was done with RBBB, SR.  The patient was offered ambulatory monitoring though he declined feeling batter about things by the end of his visit with her.  He is doing a little better with the anxiety he has had over his HR and medication reaction that he had.  He has seen his PMD who gave hims guidance on the use of PRN additional xanax dosing and this has helped.  Cardiac-wise, he denies any kind of CP, palpitations or SOB, no dizziness, near syncope or syncope.  He reports his primary issues are his axiety and his chronic neck pain/headaches that have been attributed to his C-spine disease, TMJ, and severe sinus issues, and his sinus problems.  He reports terrible sinus congestion since he was 83 years old and despite trying numerous therapies, never better.  He brings years worth of BP readings today, in the last few months is generally 130-150/70-80 range, HR since home from the hospital 60's-70's.  He reports very high BP at the MD office is routine for him and will go down by the time he gets home.  He tells me his home BP cuff was checked and confirmed accurate by his PMD office.   Past Medical History:    Diagnosis Date  . Allergic asthma 1957   "had to leave [place]"  . Anxiety   . Arthritis    "neck, hips" (02/17/2017)  . Chronic cervical pain   . Chronic hip pain    "both hips"  . Chronic lower back pain   . Daily headache    "fairly constant; caused by spurs in my neck" (02/17/2017)  . GERD (gastroesophageal reflux disease)   . Hypertension   . Prostatitis   . Spinal stenosis     Past Surgical History:  Procedure Laterality Date  . MELANOMA EXCISION     "pre-melanoma taken off my back"  . TONSILLECTOMY      Current Outpatient Prescriptions  Medication Sig Dispense Refill  . aspirin EC 81 MG tablet Take 81 mg by mouth daily.    . Glucosamine HCl (GLUCOSAMINE PO) Take 750 mg by mouth 2 (two) times daily.     Marland Kitchen LORazepam (ATIVAN) 0.5 MG tablet Take 0.5 mg by mouth every 8 (eight) hours as needed for anxiety.    Marland Kitchen losartan (COZAAR) 50 MG tablet Take 50 mg by mouth daily.    . meloxicam (MOBIC) 15 MG tablet Take 15 mg by mouth daily.    Marland Kitchen POTASSIUM PO Take 500 mg by mouth daily after lunch.    . tamsulosin (FLOMAX) 0.4 MG CAPS capsule Take 0.4 mg by mouth daily.    . Turmeric 500 MG TABS Take 500 mg by  mouth daily.    . vitamin C (ASCORBIC ACID) 500 MG tablet Take 500 mg by mouth 2 (two) times daily.     No current facility-administered medications for this visit.     Allergies:   Zanaflex [tizanidine hcl]; Ciprofloxacin; Penicillins; and Sulfa antibiotics   Social History:  The patient  reports that he quit smoking about 55 years ago. His smoking use included Cigars. He quit after 2.00 years of use. He has never used smokeless tobacco. He reports that he does not drink alcohol or use drugs.   Family History:  The patient's family history includes Hypertension in his father.  ROS:  Please see the history of present illness.  All other systems are reviewed and otherwise negative.   PHYSICAL EXAM:  VS:  BP (!) 190/110   Pulse 100   Ht 5\' 7"  (1.702 m)   Wt 166 lb (75.3  kg)   SpO2 97%   BMI 26.00 kg/m  BMI: Body mass index is 26 kg/m. Well nourished, well developed, in no acute distress  HEENT: normocephalic, atraumatic  Neck: no JVD, carotid bruits or masses Cardiac:  RRR; no significant murmurs, no rubs, or gallops Lungs:  CTA b/l, no wheezing, rhonchi or rales  Abd: soft, nontender MS: no deformity age appropriate atrophy Ext: no edema  Skin: warm and dry, no rash Neuro:  No gross deficits appreciated Psych: euthymic mood, full affect   EKG:  Done today and reviewed by myself is SR, RBBB,  93bpm, PR 18ms, QRS 153ms, QTc 473ms  Recent Labs: 02/17/2017: B Natriuretic Peptide 28.6; Magnesium 1.9; TSH 0.629 02/18/2017: BUN 14; Creatinine, Ser 0.99; Hemoglobin 13.4; Platelets 165; Potassium 3.8; Sodium 137  02/18/2017: Cholesterol 185; Cholesterol 186; HDL 47; HDL 46; LDL Cholesterol 125; LDL Cholesterol 128; Total CHOL/HDL Ratio 3.9; Total CHOL/HDL Ratio 4.0; Triglycerides 63; Triglycerides 59; VLDL 13; VLDL 12   CrCl cannot be calculated (Patient's most recent lab result is older than the maximum 21 days allowed.).   Wt Readings from Last 3 Encounters:  04/16/17 166 lb (75.3 kg)  02/25/17 161 lb 12.8 oz (73.4 kg)  02/18/17 163 lb 1.6 oz (74 kg)     Other studies reviewed: Additional studies/records reviewed today include: summarized above  ASSESSMENT AND PLAN:  1. CHB felt 2/2 Zanaflex     No symptoms of bradycardia     RBBB is not new from prior EKGs       2. HTN     Quite high again here today though he is insistent that is 2/2 to being and will go down      After waiting for about 15 minutes his BP decreased some to 188/90     His home BP are much better then here, though not completely controlled.     Increase losartan to 100mg  daily, he will take an additional 50mg  when he gets home today  3. Anxiety     He is following with PMD     He reports again this is acutely increased with worry about his heart rate/heart beat      Discussed doing ambulatory monitoring to evaluate further and hopefully will reduce his anxiety in regards to this at least.     He is agreeable, will get 48hour holter            Disposition: F/u with 1-2 weeks RPH/RN BP visit, 2 month ROV sooner if needed   Current medicines are reviewed at length with the patient today.  The patient did not have any concerns regarding medicines.  Venetia Night, PA-C 04/16/2017 10:31 AM     [facility] [place] [address] [phone] (office)  [phone] (fax)

## 2017-04-16 ENCOUNTER — Encounter: Payer: Self-pay | Admitting: Physician Assistant

## 2017-04-16 ENCOUNTER — Ambulatory Visit (INDEPENDENT_AMBULATORY_CARE_PROVIDER_SITE_OTHER): Payer: Medicare Other | Admitting: Physician Assistant

## 2017-04-16 VITALS — BP 190/110 | HR 100 | Ht 67.0 in | Wt 166.0 lb

## 2017-04-16 DIAGNOSIS — I10 Essential (primary) hypertension: Secondary | ICD-10-CM

## 2017-04-16 DIAGNOSIS — I442 Atrioventricular block, complete: Secondary | ICD-10-CM

## 2017-04-16 MED ORDER — LOSARTAN POTASSIUM 100 MG PO TABS: 100.0000 mg | ORAL_TABLET | Freq: Every day | ORAL | 4 refills | Status: DC

## 2017-04-16 NOTE — Patient Instructions (Addendum)
Medication Instructions: Your physician has recommended you make the following change in your medication:  -1) INCREASE Losartan (100 mg) - Take 1 tablet (100 mg) by mouth daily - New RX sent to pharmacy  Labwork: None Ordered  Procedures/Testing: Your physician has recommended that you wear a 48 hour holter monitor. Holter monitors are medical devices that record the heart's electrical activity. Doctors most often use these monitors to diagnose arrhythmias. Arrhythmias are problems with the speed or rhythm of the heartbeat. The monitor is a small, portable device. You can wear one while you do your normal daily activities. This is usually used to diagnose what is causing palpitations/syncope (passing out).  Follow-Up: Your physician recommends that you schedule a follow-up appointment in: 1-2 Weeks with a Pharmacists for a Blood Pressure Check  Your physician recommends that you schedule a follow-up appointment in: 2 MONTHS with Chanetta Marshall, NP or Tommye Standard, PA-C  If you need a refill on your cardiac medications before your next appointment, please call your pharmacy.

## 2017-04-26 NOTE — Telephone Encounter (Signed)
error 

## 2017-05-11 ENCOUNTER — Ambulatory Visit: Payer: Medicare Other

## 2017-05-31 DIAGNOSIS — I10 Essential (primary) hypertension: Secondary | ICD-10-CM | POA: Diagnosis not present

## 2017-05-31 DIAGNOSIS — E7800 Pure hypercholesterolemia, unspecified: Secondary | ICD-10-CM | POA: Diagnosis not present

## 2017-05-31 DIAGNOSIS — Z125 Encounter for screening for malignant neoplasm of prostate: Secondary | ICD-10-CM | POA: Diagnosis not present

## 2017-05-31 DIAGNOSIS — M2550 Pain in unspecified joint: Secondary | ICD-10-CM | POA: Diagnosis not present

## 2017-05-31 DIAGNOSIS — F411 Generalized anxiety disorder: Secondary | ICD-10-CM | POA: Diagnosis not present

## 2017-06-13 DIAGNOSIS — M2550 Pain in unspecified joint: Secondary | ICD-10-CM | POA: Insufficient documentation

## 2017-06-22 DIAGNOSIS — L821 Other seborrheic keratosis: Secondary | ICD-10-CM | POA: Diagnosis not present

## 2017-06-22 DIAGNOSIS — D2271 Melanocytic nevi of right lower limb, including hip: Secondary | ICD-10-CM | POA: Diagnosis not present

## 2017-06-22 DIAGNOSIS — Z86018 Personal history of other benign neoplasm: Secondary | ICD-10-CM | POA: Diagnosis not present

## 2017-06-22 DIAGNOSIS — C44612 Basal cell carcinoma of skin of right upper limb, including shoulder: Secondary | ICD-10-CM | POA: Diagnosis not present

## 2017-06-22 DIAGNOSIS — Z23 Encounter for immunization: Secondary | ICD-10-CM | POA: Diagnosis not present

## 2017-06-22 DIAGNOSIS — D485 Neoplasm of uncertain behavior of skin: Secondary | ICD-10-CM | POA: Diagnosis not present

## 2017-06-22 DIAGNOSIS — D225 Melanocytic nevi of trunk: Secondary | ICD-10-CM | POA: Diagnosis not present

## 2018-01-17 DIAGNOSIS — G4489 Other headache syndrome: Secondary | ICD-10-CM | POA: Insufficient documentation

## 2018-03-03 ENCOUNTER — Encounter: Payer: Self-pay | Admitting: Neurology

## 2018-03-03 ENCOUNTER — Telehealth: Payer: Self-pay | Admitting: Neurology

## 2018-03-03 ENCOUNTER — Ambulatory Visit (INDEPENDENT_AMBULATORY_CARE_PROVIDER_SITE_OTHER): Payer: Medicare Other | Admitting: Neurology

## 2018-03-03 VITALS — BP 190/90 | HR 80 | Ht 68.0 in | Wt 166.0 lb

## 2018-03-03 DIAGNOSIS — G4489 Other headache syndrome: Secondary | ICD-10-CM

## 2018-03-03 MED ORDER — GABAPENTIN 300 MG PO CAPS: 300.0000 mg | ORAL_CAPSULE | Freq: Two times a day (BID) | ORAL | 3 refills | Status: DC

## 2018-03-03 NOTE — Patient Instructions (Signed)
We will start gabapentin 300 mg at night for 2 weeks, then begin 300 mg twice a day, call for any dose adjustments.   Neurontin (gabapentin) may result in drowsiness, ankle swelling, gait instability, or possibly dizziness. Please contact our office if significant side effects occur with this medication.

## 2018-03-03 NOTE — Telephone Encounter (Signed)
Medicare order sent to GI. No auth they will reach out to the pt to schedule.  °

## 2018-03-03 NOTE — Progress Notes (Signed)
Reason for visit: Headache  Referring physician: Dr. Casimer Bilis [name] is a 84 y.o. male  History of present illness:  Corey [name] is an 84 year old right-handed white male with a history of unusual paresthesias involving the right frontotemporal area and face that began about 2 years ago.  The patient has noted some sensitivity on the right side of the face, if he touches his right ear or right temple area he will have a tingling sensation that goes down into his right face involving the teeth.  This is a ongoing problem for him.  The patient over the last year has noted some tingling and itching sensations on the back of his head, the patient can touch the back of the skull and have onset of symptoms, it is uncomfortable for him to lay flat on his back with his head touching a pillow.  The sensations are all over the head, sometimes he may have some achy sensations behind the eyes or pressure sensation in the frontal areas.  He has chronic sinus drainage that has been present for most of his life.  He has a history of lumbosacral spinal stenosis, he gets injections for this intermittently.  He does have neck discomfort that actually has improved some recently, he has some restriction of movement of the neck, he particularly has increased discomfort when turning head to the left.  The patient denies any significant problems with balance, he denies any issues controlling the bowels or the bladder.  He does report some occasional dizziness if he looks up.  Dizziness is not a big problem for him, however.  The patient has had MRI evaluation of the cervical spine in 2015.  This study shows mild foraminal narrowing bilaterally at C3-4 level, some facet joint arthritis at C4-5 level without stenosis and mild spinal stenosis at C5-6 level and some uncovertebral spurring on the left at the C6-7 level without foraminal stenosis.  He is sent to this office for an evaluation.  Past Medical  History:  Diagnosis Date  . Allergic asthma 1957   "had to leave [place]"  . Anxiety   . Arthritis    "neck, hips" (02/17/2017)  . Chronic cervical pain   . Chronic hip pain    "both hips"  . Chronic lower back pain   . Daily headache    "fairly constant; caused by spurs in my neck" (02/17/2017)  . GERD (gastroesophageal reflux disease)   . Hypertension   . Prostatitis   . Spinal stenosis     Past Surgical History:  Procedure Laterality Date  . MELANOMA EXCISION     "pre-melanoma taken off my back"  . TONSILLECTOMY      Family History  Problem Relation Age of Onset  . Hypertension Father     Social history:  reports that he quit smoking about 56 years ago. His smoking use included cigars. He quit after 2.00 years of use. He has never used smokeless tobacco. He reports that he does not drink alcohol or use drugs.  Medications:  Prior to Admission medications   Medication Sig Start Date End Date Taking? Authorizing Provider  aspirin EC 81 MG tablet Take 81 mg by mouth daily.   Yes [provider]  Glucosamine HCl (GLUCOSAMINE PO) Take 750 mg by mouth 2 (two) times daily.    Yes [provider]  LORazepam (ATIVAN) 0.5 MG tablet Take 0.5 mg by mouth every 8 (eight) hours as needed for anxiety.  Yes [provider]  losartan (COZAAR) 100 MG tablet Take 1 tablet (100 mg total) by mouth daily. 04/16/17  Yes Baldwin Jamaica, PA-C  POTASSIUM PO Take 500 mg by mouth daily after lunch.   Yes [provider]  tamsulosin (FLOMAX) 0.4 MG CAPS capsule Take 0.4 mg by mouth daily. 07/30/15  Yes [provider]  Turmeric 500 MG TABS Take 500 mg by mouth daily.   Yes [provider]  vitamin C (ASCORBIC ACID) 500 MG tablet Take 500 mg by mouth 2 (two) times daily.   Yes [provider]  gabapentin (NEURONTIN) 300 MG capsule Take 1 capsule (300 mg total) by mouth 2 (two) times daily. 03/03/18   Kathrynn Ducking, MD  meloxicam (MOBIC)  15 MG tablet Take 15 mg by mouth daily.    [provider]      Allergies  Allergen Reactions  . Zanaflex [Tizanidine Hcl] Other (See Comments)    Severe bradycardia   . Ciprofloxacin Itching and Rash  . Penicillins Itching and Rash    Has patient had a PCN reaction causing immediate rash, facial/tongue/throat swelling, SOB or lightheadedness with hypotension:yes Has patient had a PCN reaction causing severe rash involving mucus membranes or skin necrosis:no Has patient had a PCN reaction that required hospitalization:no Has patient had a PCN reaction occurring within the last 10 years:no If all of the above answers are "NO", then may proceed with Cephalosporin use.   . Sulfa Antibiotics Itching and Rash    ROS:  Out of a complete 14 system review of symptoms, the patient complains only of the following symptoms, and all other reviewed systems are negative.  Headache, tingling Sinus drainage, allergies  Blood pressure (!) 190/90, pulse 80, height 5\' 8"  (1.727 m), weight 166 lb (75.3 kg), SpO2 98 %.  Physical Exam  General: The patient is alert and cooperative at the time of the examination.  Eyes: Pupils are equal, round, and reactive to light. Discs are flat bilaterally.  Neck: The neck is supple, no carotid bruits are noted.  Respiratory: The respiratory examination is clear.  Cardiovascular: The cardiovascular examination reveals a regular rate and rhythm, no obvious murmurs or rubs are noted.  Neuromuscular: The patient lacks about 30 degrees of full lateral rotation of the cervical spine bilaterally.  Skin: Extremities are without significant edema.  Neurologic Exam  Mental status: The patient is alert and oriented x 3 at the time of the examination. The patient has apparent normal recent and remote memory, with an apparently normal attention span and concentration ability.  Cranial nerves: Facial symmetry is present. There is good sensation of the face  to pinprick and soft touch bilaterally. The strength of the facial muscles and the muscles to head turning and shoulder shrug are normal bilaterally. Speech is well enunciated, no aphasia or dysarthria is noted. Extraocular movements are full. Visual fields are full. The tongue is midline, and the patient has symmetric elevation of the soft palate. No obvious hearing deficits are noted.  Motor: The motor testing reveals 5 over 5 strength of all 4 extremities. Good symmetric motor tone is noted throughout.  Sensory: Sensory testing is intact to pinprick, soft touch, vibration sensation, and position sense on all 4 extremities. No evidence of extinction is noted.  Coordination: Cerebellar testing reveals good finger-nose-finger and heel-to-shin bilaterally.  Gait and station: Gait is normal. Tandem gait is normal. Romberg is negative. No drift is seen.  Reflexes: Deep tendon reflexes are symmetric  and normal bilaterally. Toes are downgoing bilaterally.   Assessment/Plan:  1.  Facial and occipital paresthesias  2.  Cervical spondylosis  The patient has an unusual headache syndrome, he reports paresthesias down into the right face and teeth area by touching his right temporal area, he has more generalized itching and tingling sensations on the back of the head and top of the head with pressure to the back of the head.  The headache syndrome is somewhat atypical, he does not have clear evidence of occipital or trigeminal neuralgia.  The patient will be sent for MRI of the brain, he will have blood work today, he will be started on gabapentin taking 300 mg at night for 2 weeks and then go to 300 mg twice daily.  He will follow-up in about 4 months.  Jill Alexanders MD 03/03/2018 8:30 AM  [facility] [address] [place], [place] [address]  Phone [phone] Fax [phone]

## 2018-03-04 LAB — COMPREHENSIVE METABOLIC PANEL
ALBUMIN: 4.6 g/dL (ref 3.5–4.7)
ALT: 14 IU/L (ref 0–44)
AST: 14 IU/L (ref 0–40)
Albumin/Globulin Ratio: 2.1 (ref 1.2–2.2)
Alkaline Phosphatase: 62 IU/L (ref 39–117)
BUN/Creatinine Ratio: 14 (ref 10–24)
BUN: 18 mg/dL (ref 8–27)
Bilirubin Total: 0.6 mg/dL (ref 0.0–1.2)
CALCIUM: 9.5 mg/dL (ref 8.6–10.2)
CO2: 26 mmol/L (ref 20–29)
CREATININE: 1.3 mg/dL — AB (ref 0.76–1.27)
Chloride: 103 mmol/L (ref 96–106)
GFR calc Af Amer: 58 mL/min/{1.73_m2} — ABNORMAL LOW (ref >59)
GFR, EST NON AFRICAN AMERICAN: 50 mL/min/{1.73_m2} — AB (ref >59)
Globulin, Total: 2.2 g/dL (ref 1.5–4.5)
Glucose: 95 mg/dL (ref 65–99)
Potassium: 5 mmol/L (ref 3.5–5.2)
Sodium: 142 mmol/L (ref 134–144)
Total Protein: 6.8 g/dL (ref 6.0–8.5)

## 2018-03-04 LAB — B. BURGDORFI ANTIBODIES: Lyme IgG/IgM Ab: <0.91 {ISR} (ref 0.00–0.90)

## 2018-03-04 LAB — ANA W/REFLEX: Anti Nuclear Antibody(ANA): NEGATIVE

## 2018-03-04 LAB — SEDIMENTATION RATE

## 2018-03-13 ENCOUNTER — Ambulatory Visit
Admission: RE | Admit: 2018-03-13 | Discharge: 2018-03-13 | Disposition: A | Discharge status: Home / Self Care | Payer: Medicare Other | Source: Ambulatory Visit | Attending: Neurology | Admitting: Neurology

## 2018-03-13 DIAGNOSIS — G4489 Other headache syndrome: Secondary | ICD-10-CM

## 2018-03-13 MED ORDER — GADOBENATE DIMEGLUMINE 529 MG/ML IV SOLN
15.0000 mL | Freq: Once | INTRAVENOUS | Status: AC | PRN
  Administered 2018-03-13: 15 mL via INTRAVENOUS

## 2018-03-14 ENCOUNTER — Telehealth: Payer: Self-pay | Admitting: Neurology

## 2018-03-14 NOTE — Telephone Encounter (Signed)
Pt requesting a call to discuss gabapentin (NEURONTIN) 300 MG capsule stating he is needing to know if he can take 2 tablets at once or if they need to be spread throughout the day.

## 2018-03-14 NOTE — Telephone Encounter (Signed)
Dr Willis- please advise 

## 2018-03-14 NOTE — Telephone Encounter (Signed)
The patient is to take the gabapentin 300 mg twice daily, not both capsules in the evening.

## 2018-03-14 NOTE — Telephone Encounter (Signed)
  I called the patient.  The patient has a moderate level of small vessel disease, nothing obvious that should be causing headaches, no significant sinus disease.  The patient will be going up on the gabapentin, he will go to 600 mg daily.   MRI brain 03/14/18:  IMPRESSION: This MRI of the brain with and without contrast shows the following: 1.    Extensive T2/FLAIR hyperintense foci in the hemispheres consistent with advanced chronic microvascular ischemic changes.  None of the foci appear to be acute. 2.    Mild generalized cortical atrophy, typical for age. 3.    The pituitary gland is thinned within a mildly enlarged sella turcica.   This is likely incidental but could also be due to elevated intracranial pressures.  If papilledema is present, consider measurement of CSF pressure. 4.    There is a normal enhancement pattern and there are no acute findings.

## 2018-03-21 MED ORDER — DULOXETINE HCL 30 MG PO CPEP: ORAL_CAPSULE | ORAL | 3 refills | Status: DC

## 2018-03-21 NOTE — Addendum Note (Signed)
Addended by: Kathrynn Ducking on: 03/21/2018 01:05 PM   Modules accepted: Orders

## 2018-03-21 NOTE — Telephone Encounter (Signed)
I called the patient.  The patient developed a problem with shaking chills 3 hours after taking dose of gabapentin, he claims that he never ran a fever with this.  I have never heard of the side effects from the gabapentin, but we will stop the drug, try Cymbalta instead.

## 2018-03-21 NOTE — Telephone Encounter (Signed)
Pt called he started taking 2nd dose Saturday night. Last night after taking 2nd dose he experienced tremors, shaking and chills that lasted about 2-3 hours. He is not wanting to take it again. Please call to advise

## 2018-06-28 ENCOUNTER — Ambulatory Visit: Payer: Medicare Other | Admitting: Nurse Practitioner

## 2018-08-12 ENCOUNTER — Ambulatory Visit: Payer: Medicare Other | Admitting: Neurology

## 2019-05-30 DIAGNOSIS — Z0000 Encounter for general adult medical examination without abnormal findings: Secondary | ICD-10-CM | POA: Insufficient documentation

## 2019-08-16 ENCOUNTER — Other Ambulatory Visit: Payer: Self-pay | Admitting: Orthopedic Surgery

## 2019-08-16 DIAGNOSIS — M5416 Radiculopathy, lumbar region: Secondary | ICD-10-CM

## 2019-09-11 ENCOUNTER — Ambulatory Visit
Admission: RE | Admit: 2019-09-11 | Discharge: 2019-09-11 | Disposition: A | Discharge status: Home / Self Care | Payer: Medicare Other | Source: Ambulatory Visit | Attending: Orthopedic Surgery | Admitting: Orthopedic Surgery

## 2019-09-11 ENCOUNTER — Other Ambulatory Visit: Payer: Self-pay

## 2019-09-11 DIAGNOSIS — M5416 Radiculopathy, lumbar region: Secondary | ICD-10-CM

## 2019-09-11 IMAGING — MR MR LUMBAR SPINE W/O CM
4 of 5 series · 23 of 48 positions shown · non-contrast
Comparison: Lumbar spine MRI [DATE]

CLINICAL DATA: Lumbar radiculopathy. Additional history provided:
Low back pain radiating into right leg and right hip, numbness in
right thigh, symptoms began 5 years ago, no prior back surgery.

EXAM:
MRI LUMBAR SPINE WITHOUT CONTRAST
TECHNIQUE: Multiplanar, multisequence MR imaging of the lumbar spine was
performed. No intravenous contrast was administered.

[Series 2: T2 · sagittal · 4.0mm · 0.55mm/px · 6 of 16 slices shown (1 of 2)]
[im 1/16]
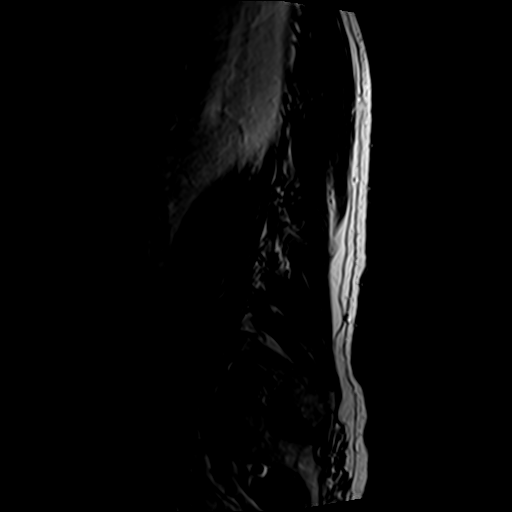
[im 4/16]
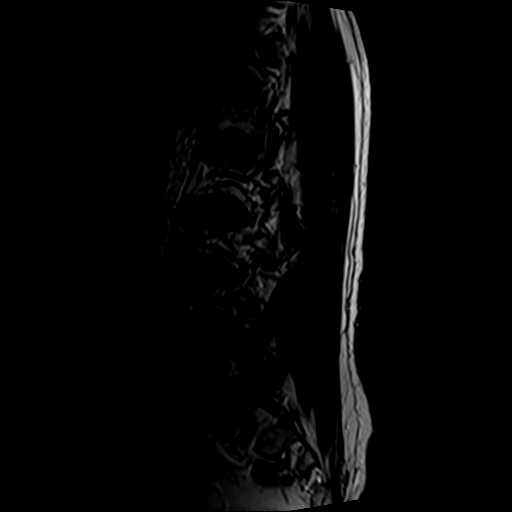
[im 7/16]
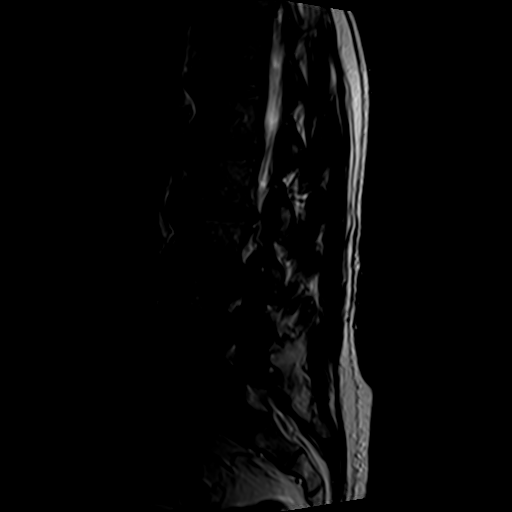
[im 10/16]
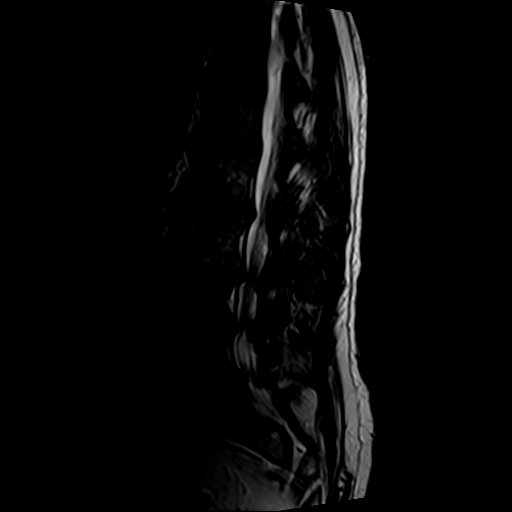
[im 13/16]
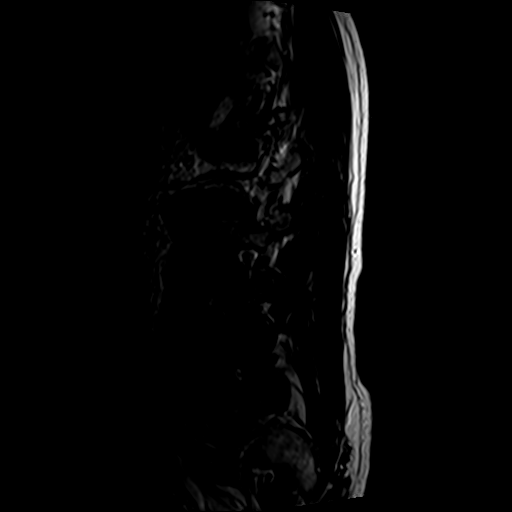
[im 16/16]
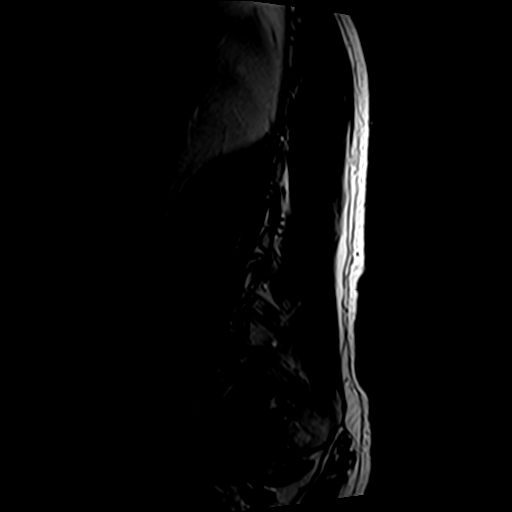

[Series 4: T1 · sagittal · 4.0mm · 0.55mm/px · 5 of 16 slices shown (1 of 2)]
[im 1/16]
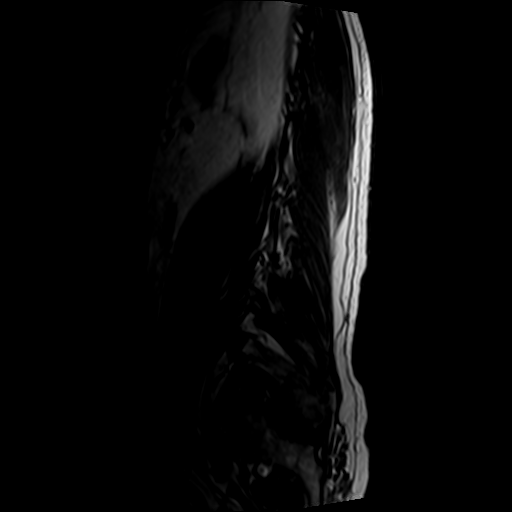
[im 4/16]
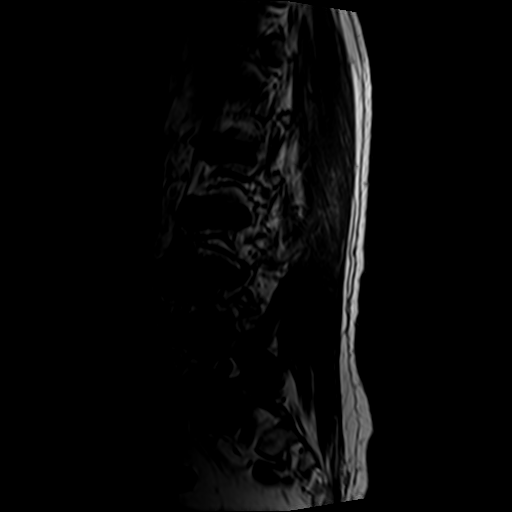
[im 7/16]
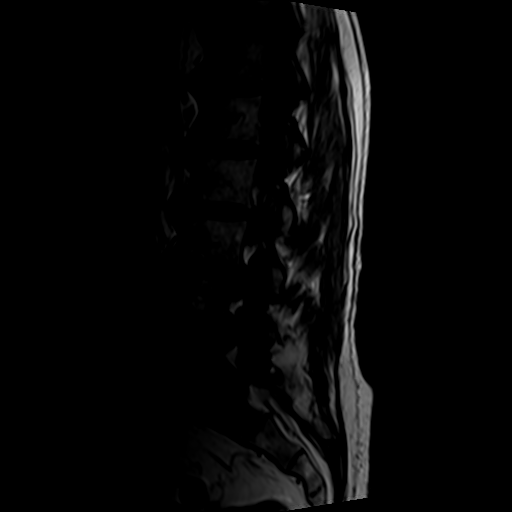
[im 10/16]
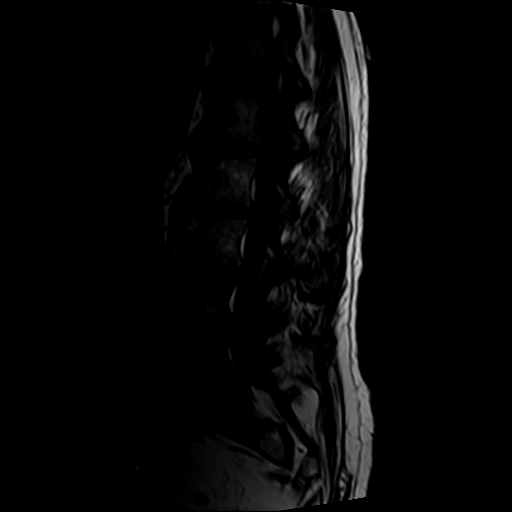
[im 16/16]
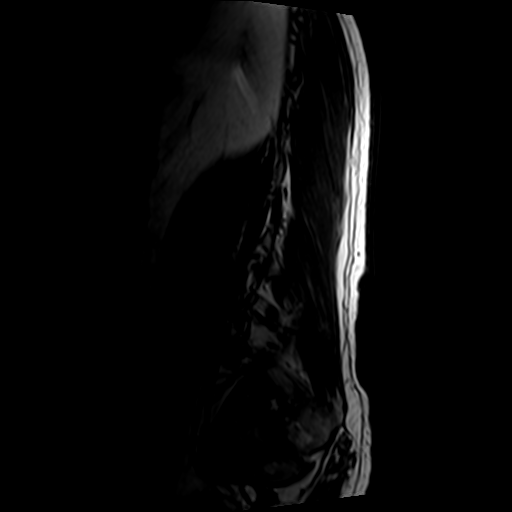

[Series 5: T2 · axial · 4.0mm · 0.70mm/px · z∈[-138,+64]mm · 9 of 39 slices shown (2 of 2)]
[im 1/39]
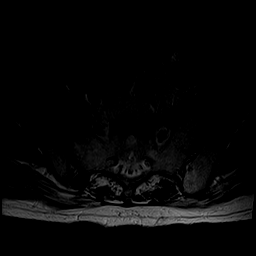
[im 6/39]
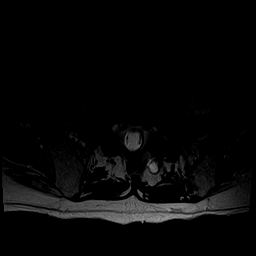
[im 11/39]
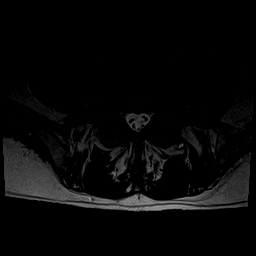
[im 17/39]
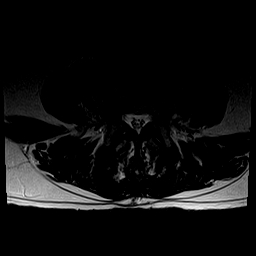
[im 20/39]
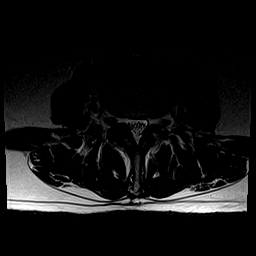
[im 22/39]
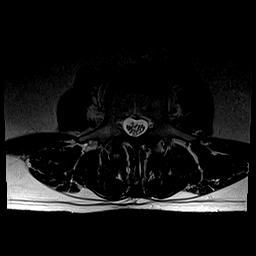
[im 28/39]
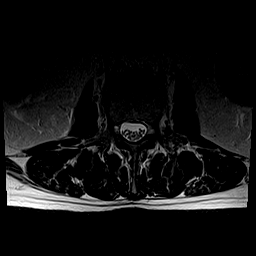
[im 33/39]
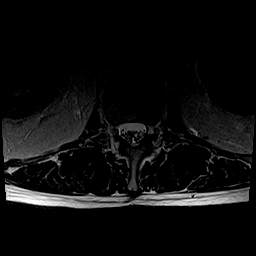
[im 39/39]
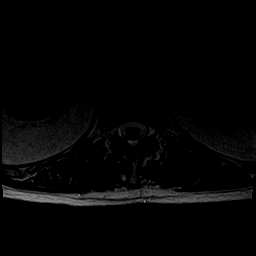

[Series 6: T1 · axial · 4.0mm · 0.35mm/px · z∈[-113,+33]mm · 3 of 39 slices shown (2 of 2)]
[im 6/39]
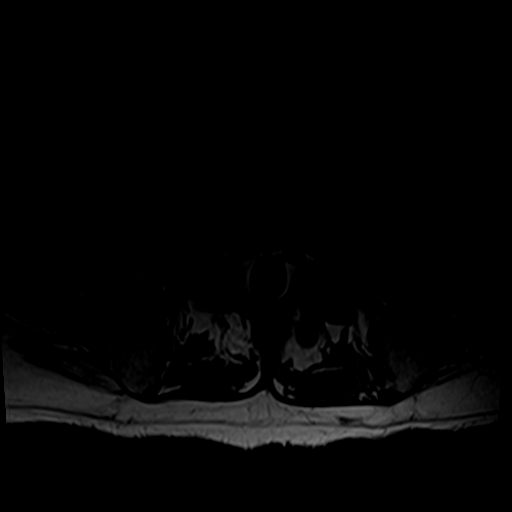
[im 20/39]
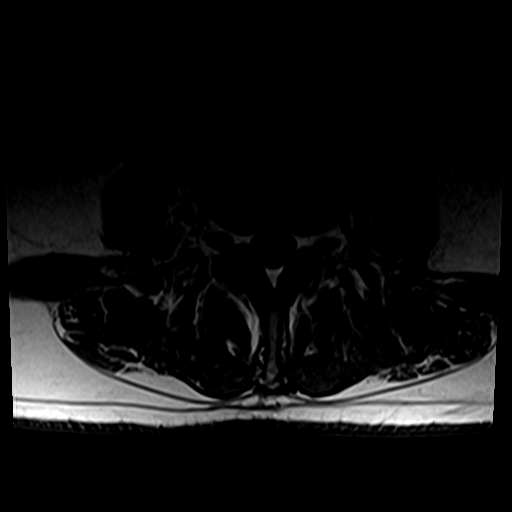
[im 33/39]
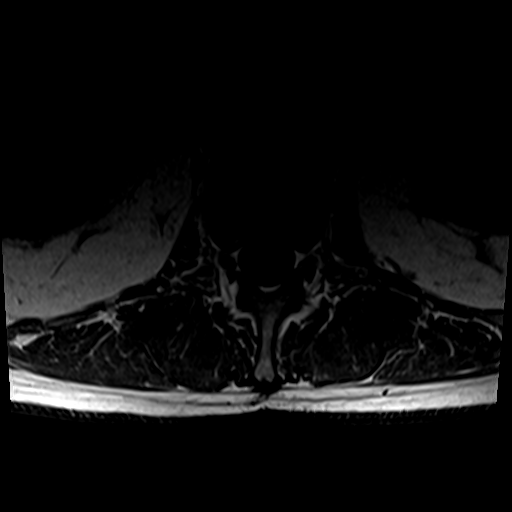

[23 of 48 positions shown; findings below may reference images not displayed]

FINDINGS: Segmentation: For the purposes of this dictation, five lumbar
vertebrae are assumed and the caudal most well-formed intervertebral
disc is designated L5-S1.

Alignment: Subtle reversal of the expected lumbar lordosis. 2 mm
L3-L4 grade 1 retrolisthesis.

Vertebrae: Vertebral body height is maintained no suspicious osseous
lesion or significant marrow edema. Multilevel degenerative endplate
irregularity greatest at L3-L4 and L4-L5. There is also mild fatty
degenerative endplate marrow signal at these levels. L4 vertebral
body hemangioma. Multilevel ventrolateral osteophytes.

Conus medullaris and cauda equina: Conus extends to the L1-L2 level.
No signal abnormality within the visualized distal spinal cord.

Paraspinal and other soft tissues: Incompletely assessed bilateral
renal cysts. Atrophy of the lumbar paraspinal musculature.

Disc levels:

Unless otherwise stated, the level by level findings below have not
significantly changed since prior MRI [DATE].

Multilevel disc degeneration greatest at L3-L4 (moderate and L4-L5
(moderate/advanced), similar to prior examination.

T12-L1: Mild facet arthrosis. No disc herniation. No significant
canal or foraminal stenosis.

L1-L2: Mild facet arthrosis. No disc herniation. No significant
canal or foraminal stenosis.

L2-L3: Disc bulge. Mild facet arthrosis/ligamentum flavum
hypertrophy. Mild bilateral subarticular and central canal
narrowing. Mild relative left neural foraminal narrowing.

L3-L4: Grade 1 retrolisthesis. Disc bulge with endplate spurring.
Moderate facet arthrosis/ligamentum flavum hypertrophy. Small amount
of fluid within the bilateral facet joints. Severe spinal canal
stenosis has progressed. Mild bilateral neural foraminal narrowing.

L4-L5: Disc bulge with endplate spurring. Advanced facet
arthrosis/ligamentum flavum hypertrophy. Severe spinal canal
stenosis, progressed. Moderate/severe bilateral neural foraminal
narrowing (greater on the right), progressed.

L5-S1: Disc bulge. Advanced facet arthrosis with ligamentum flavum
hypertrophy (greater on the left). A previously demonstrated right
synovial facet cyst is no longer definitively appreciated. 6 x 1 mm
ventrally projecting synovial facet cyst on the left. Bilateral
subarticular stenosis with crowding of the bilateral descending S1
nerve roots (series 5, image 33). Right subarticular stenosis has
decreased from prior examination. Central canal patent. Moderate
bilateral neural foraminal narrowing (greater on the left),
unchanged. Posteriorly projecting synovial facet cysts at this level
measuring up to 9 mm.
IMPRESSION: At L3-L4, multifactorial severe spinal canal stenosis has
progressed. Unchanged mild bilateral neural foraminal narrowing.

At L4-L5, multifactorial severe spinal canal stenosis has
progressed. Moderate/severe bilateral neural foraminal narrowing
(greater on the right), also progressed.

At L5-S1, a previously demonstrated right-sided anteriorly
projecting synovial facet cyst is no longer definitively seen. A
small ventrally projecting synovial facet cyst on the left is new.
Multifactorial bilateral subarticular stenosis with crowding of the
bilateral descending S1 nerve roots. Right subarticular stenosis has
decreased as compared to prior examination. Unchanged moderate
bilateral neural foraminal narrowing (greater on the left).

Lumbar spondylosis is otherwise unchanged from prior MRI [DATE].

## 2019-09-25 ENCOUNTER — Other Ambulatory Visit: Payer: Self-pay | Admitting: Orthopedic Surgery

## 2019-10-13 NOTE — Progress Notes (Signed)
[place], [place] - [id number] [place] [address] Phone: [phone] Fax: [phone]      Your procedure is scheduled on October 19, 2019.  Report to [facility] Main Entrance "A" at 11:40 A.M., and check in at the Admitting office.  Call this number if you have problems the morning of surgery:  [phone]  Call [phone] if you have any questions prior to your surgery date Monday-Friday 8am-4pm    Remember:  Do not eat or drink after midnight the night before your surgery  You may drink clear liquids until 10:40 the morning of your surgery.   Clear liquids allowed are: Water, Non-Citrus Juices (without pulp), Carbonated Beverages, Clear Tea, Black Coffee Only, and Gatorade  Please complete your PRE-SURGERY ENSURE that was provided to you by 10:40 the morning of surgery.  Please, if able, drink it in one setting. DO NOT SIP.    Take these medicines the morning of surgery with A SIP OF WATER: LORazepam (ATIVAN) - as needed   Follow your surgeon's instructions on when to stop Aspirin.  If no instructions were given by your surgeon then you will need to call the office to get those instructions.     As of today, stop taking all Aspirin (unless instructed by your doctor) and other Aspirin containing products, Vitamins, Fish Oils, and Herbal Medications. Also stop all NSAIDS i.e. Advil, Ibuprofen, Motrin, Aleve, Anaprox, Naproxen, BC, Goody Powders, and all Supplements.   No Smoking of any kind, Tobacco, or Alcohol products 24 hours prior to your procedure. If you use a CPAP at night, you may bring all equipment for your overnight stay.                        Do not wear jewelry.            Do not wear lotions, powders, colognes, or deodorant.             Men may shave face and neck.            Do not bring valuables to the hospital.            [facility] is not responsible for any belongings or valuables.    Contacts, glasses, dentures or bridgework may not be worn into surgery.      For patients admitted to the hospital, discharge time will be determined by your treatment team.   Patients discharged the day of surgery will not be allowed to drive home, and someone needs to stay with them for 24 hours.    Special instructions:   [place]- Preparing For Surgery  Before surgery, you can play an important role. Because skin is not sterile, your skin needs to be as free of germs as possible. You can reduce the number of germs on your skin by washing with CHG (chlorahexidine gluconate) Soap before surgery.  CHG is an antiseptic cleaner which kills germs and bonds with the skin to continue killing germs even after washing.    Oral Hygiene is also important to reduce your risk of infection.  Remember - BRUSH YOUR TEETH THE MORNING OF SURGERY WITH YOUR REGULAR TOOTHPASTE  Please do not use if you have an allergy to CHG or antibacterial soaps. If your skin becomes reddened/irritated stop using the CHG.  Do not shave (including legs and underarms) for at least 48 hours prior to first CHG shower. It is OK  to shave your face.  Please follow these instructions carefully.   1. Shower the NIGHT BEFORE SURGERY and the MORNING OF SURGERY with CHG Soap.   2. If you chose to wash your hair, wash your hair first as usual with your normal shampoo.  3. After you shampoo, rinse your hair and body thoroughly to remove the shampoo.  4. Use CHG as you would any other liquid soap. You can apply CHG directly to the skin and wash gently with a scrungie or a clean washcloth.   5. Apply the CHG Soap to your body ONLY FROM THE NECK DOWN.  Do not use on open wounds or open sores. Avoid contact with your eyes, ears, mouth and genitals (private parts). Wash Face and genitals (private parts)  with your normal soap.   6. Wash thoroughly, paying special attention to the area where your surgery will be  performed.  7. Thoroughly rinse your body with warm water from the neck down.  8. DO NOT shower/wash with your normal soap after using and rinsing off the CHG Soap.  9. Pat yourself dry with a CLEAN TOWEL.  10. Wear CLEAN PAJAMAS to bed the night before surgery, wear comfortable clothes the morning of surgery  11. Place CLEAN SHEETS on your bed the night of your first shower and DO NOT SLEEP WITH PETS.   Day of Surgery:   Do not apply any deodorants/lotions.  Please wear clean clothes to the hospital/surgery center.   Remember to brush your teeth WITH YOUR REGULAR TOOTHPASTE.   Please read over the following fact sheets that you were given.

## 2019-10-16 ENCOUNTER — Other Ambulatory Visit (HOSPITAL_COMMUNITY)
Admission: RE | Admit: 2019-10-16 | Discharge: 2019-10-16 | Disposition: A | Discharge status: Home / Self Care | Payer: Medicare Other | Source: Ambulatory Visit | Attending: Orthopedic Surgery | Admitting: Orthopedic Surgery

## 2019-10-16 ENCOUNTER — Encounter (HOSPITAL_COMMUNITY): Payer: Self-pay

## 2019-10-16 ENCOUNTER — Encounter (HOSPITAL_COMMUNITY)
Admission: RE | Admit: 2019-10-16 | Discharge: 2019-10-16 | Disposition: A | Discharge status: Home / Self Care | Payer: Medicare Other | Source: Ambulatory Visit | Attending: Orthopedic Surgery | Admitting: Orthopedic Surgery

## 2019-10-16 ENCOUNTER — Other Ambulatory Visit: Payer: Self-pay

## 2019-10-16 DIAGNOSIS — R9431 Abnormal electrocardiogram [ECG] [EKG]: Secondary | ICD-10-CM | POA: Insufficient documentation

## 2019-10-16 DIAGNOSIS — Z79899 Other long term (current) drug therapy: Secondary | ICD-10-CM | POA: Diagnosis not present

## 2019-10-16 DIAGNOSIS — Z01818 Encounter for other preprocedural examination: Secondary | ICD-10-CM | POA: Insufficient documentation

## 2019-10-16 DIAGNOSIS — Z791 Long term (current) use of non-steroidal anti-inflammatories (NSAID): Secondary | ICD-10-CM | POA: Diagnosis not present

## 2019-10-16 DIAGNOSIS — I10 Essential (primary) hypertension: Secondary | ICD-10-CM | POA: Diagnosis not present

## 2019-10-16 DIAGNOSIS — M1990 Unspecified osteoarthritis, unspecified site: Secondary | ICD-10-CM | POA: Diagnosis not present

## 2019-10-16 DIAGNOSIS — Z85820 Personal history of malignant melanoma of skin: Secondary | ICD-10-CM | POA: Diagnosis not present

## 2019-10-16 DIAGNOSIS — I4510 Unspecified right bundle-branch block: Secondary | ICD-10-CM | POA: Insufficient documentation

## 2019-10-16 DIAGNOSIS — K219 Gastro-esophageal reflux disease without esophagitis: Secondary | ICD-10-CM | POA: Diagnosis not present

## 2019-10-16 DIAGNOSIS — Z20822 Contact with and (suspected) exposure to covid-19: Secondary | ICD-10-CM | POA: Diagnosis not present

## 2019-10-16 DIAGNOSIS — M48062 Spinal stenosis, lumbar region with neurogenic claudication: Secondary | ICD-10-CM | POA: Diagnosis not present

## 2019-10-16 DIAGNOSIS — Z7982 Long term (current) use of aspirin: Secondary | ICD-10-CM | POA: Diagnosis not present

## 2019-10-16 DIAGNOSIS — Z87891 Personal history of nicotine dependence: Secondary | ICD-10-CM | POA: Diagnosis not present

## 2019-10-16 DIAGNOSIS — F419 Anxiety disorder, unspecified: Secondary | ICD-10-CM | POA: Diagnosis not present

## 2019-10-16 LAB — URINALYSIS, ROUTINE W REFLEX MICROSCOPIC
Bilirubin Urine: NEGATIVE
Glucose, UA: NEGATIVE mg/dL
Hgb urine dipstick: NEGATIVE
Ketones, ur: NEGATIVE mg/dL
Leukocytes,Ua: NEGATIVE
Nitrite: NEGATIVE
Protein, ur: NEGATIVE mg/dL
Specific Gravity, Urine: 1.012 (ref 1.005–1.030)
pH: 6 (ref 5.0–8.0)

## 2019-10-16 LAB — CBC WITH DIFFERENTIAL/PLATELET
Abs Immature Granulocytes: 0.02 10*3/uL (ref 0.00–0.07)
Basophils Absolute: 0 10*3/uL (ref 0.0–0.1)
Basophils Relative: 1 %
Eosinophils Absolute: 0.1 10*3/uL (ref 0.0–0.5)
Eosinophils Relative: 1 %
HCT: 43.9 % (ref 39.0–52.0)
Hemoglobin: 15.1 g/dL (ref 13.0–17.0)
Immature Granulocytes: 0 %
Lymphocytes Relative: 18 %
Lymphs Abs: 1.4 10*3/uL (ref 0.7–4.0)
MCH: 30.2 pg (ref 26.0–34.0)
MCHC: 34.4 g/dL (ref 30.0–36.0)
MCV: 87.8 fL (ref 80.0–100.0)
Monocytes Absolute: 0.4 10*3/uL (ref 0.1–1.0)
Monocytes Relative: 5 %
Neutro Abs: 5.5 10*3/uL (ref 1.7–7.7)
Neutrophils Relative %: 75 %
Platelets: 223 10*3/uL (ref 150–400)
RBC: 5 MIL/uL (ref 4.22–5.81)
RDW: 12.9 % (ref 11.5–15.5)
WBC: 7.4 10*3/uL (ref 4.0–10.5)
nRBC: 0 % (ref 0.0–0.2)

## 2019-10-16 LAB — COMPREHENSIVE METABOLIC PANEL
ALT: 17 U/L (ref 0–44)
AST: 18 U/L (ref 15–41)
Albumin: 4.2 g/dL (ref 3.5–5.0)
Alkaline Phosphatase: 55 U/L (ref 38–126)
Anion gap: 8 (ref 5–15)
BUN: 17 mg/dL (ref 8–23)
CO2: 27 mmol/L (ref 22–32)
Calcium: 9.3 mg/dL (ref 8.9–10.3)
Chloride: 104 mmol/L (ref 98–111)
Creatinine, Ser: 1.05 mg/dL (ref 0.61–1.24)
GFR calc Af Amer: >60 mL/min (ref >60)
GFR calc non Af Amer: >60 mL/min (ref >60)
Glucose, Bld: 120 mg/dL — ABNORMAL HIGH (ref 70–99)
Potassium: 4.1 mmol/L (ref 3.5–5.1)
Sodium: 139 mmol/L (ref 135–145)
Total Bilirubin: 1 mg/dL (ref 0.3–1.2)
Total Protein: 7 g/dL (ref 6.5–8.1)

## 2019-10-16 LAB — TYPE AND SCREEN
ABO/RH(D): B POS
Antibody Screen: NEGATIVE

## 2019-10-16 LAB — SARS CORONAVIRUS 2 (TAT 6-24 HRS): SARS Coronavirus 2: NEGATIVE

## 2019-10-16 LAB — SURGICAL PCR SCREEN
MRSA, PCR: NEGATIVE
Staphylococcus aureus: POSITIVE — AB

## 2019-10-16 LAB — PROTIME-INR
INR: 1 (ref 0.8–1.2)
Prothrombin Time: 13.2 seconds (ref 11.4–15.2)

## 2019-10-16 LAB — APTT: aPTT: 31 seconds (ref 24–36)

## 2019-10-16 LAB — ABO/RH: ABO/RH(D): B POS

## 2019-10-16 NOTE — Progress Notes (Signed)
Your procedure is scheduled on Thursday October 19, 2019.  Report to [facility] Main Entrance "A" at 11:40 A.M., and check in at the Admitting office.  Call this number if you have problems the morning of surgery: [phone]  Call [phone] if you have any questions prior to your surgery date Monday-Friday 8am-4pm   Remember: Do not eat after midnight the night before your surgery  You may drink clear liquids until 11:40 A.M. the morning of your surgery.   Clear liquids allowed are: Water, Non-Citrus Juices (without pulp), Carbonated Beverages, Clear Tea, Black Coffee Only, and Gatorade   Take these medicines the morning of surgery with A SIP OF WATER: LORazepam (ATIVAN) - as needed  Follow your surgeon's instructions on when to stop Aspirin.  If no instructions were given by your surgeon then you will need to call the office to get those instructions.    Please complete your PRE-SURGERY ENSURE that was provided to you by 11:40 A.M. the morning of surgery.  Please, if able, drink it in one setting. DO NOT SIP.   As of today, STOP taking Aleve, Naproxen, Ibuprofen, Motrin, Advil, Goody's, BC's, all herbal medications, fish oil, and all vitamins (including: Glucosamine HCl (GLUCOSAMINE PO), Potassium, Turmeric, vitamin C)     The Morning of Surgery  Do not wear jewelry, make-up or nail polish.  Do not wear lotions, powders, colognes, or deodorant Men may shave face and neck.  Do not bring valuables to the hospital.  [facility] is not responsible for any belongings or valuables.  If you are a smoker, DO NOT Smoke 24 hours prior to surgery  If you wear a CPAP at night please bring your mask the morning of surgery   Remember that you must have someone to transport you home after your surgery, and remain with you for 24 hours if you are discharged the same day.   Please bring cases for contacts, glasses, hearing aids, dentures or bridgework because it cannot be worn into  surgery.    Leave your suitcase in the car.  After surgery it may be brought to your room.  For patients admitted to the hospital, discharge time will be determined by your treatment team.  Patients discharged the day of surgery will not be allowed to drive home.    Special instructions:   [place]- Preparing For Surgery  Before surgery, you can play an important role. Because skin is not sterile, your skin needs to be as free of germs as possible. You can reduce the number of germs on your skin by washing with CHG (chlorahexidine gluconate) Soap before surgery.  CHG is an antiseptic cleaner which kills germs and bonds with the skin to continue killing germs even after washing.    Oral Hygiene is also important to reduce your risk of infection.  Remember - BRUSH YOUR TEETH THE MORNING OF SURGERY WITH YOUR REGULAR TOOTHPASTE  Please do not use if you have an allergy to CHG or antibacterial soaps. If your skin becomes reddened/irritated stop using the CHG.  Do not shave (including legs and underarms) for at least 48 hours prior to first CHG shower. It is OK to shave your face.  Please follow these instructions carefully.   1. Shower the NIGHT BEFORE SURGERY and the MORNING OF SURGERY with CHG Soap.   2. If you chose to wash your hair and body, wash as usual with your normal shampoo and body-wash/soap.  3. Rinse your hair and body  thoroughly to remove the shampoo and soap.  4. Apply CHG directly to the skin (ONLY FROM THE NECK DOWN) and wash gently with a scrungie or a clean washcloth.   5. Do not use on open wounds or open sores. Avoid contact with your eyes, ears, mouth and genitals (private parts). Wash Face and genitals (private parts)  with your normal soap.   6. Wash thoroughly, paying special attention to the area where your surgery will be performed.  7. Thoroughly rinse your body with warm water from the neck down.  8. DO NOT shower/wash with your normal soap after using  and rinsing off the CHG Soap.  9. Pat yourself dry with a CLEAN TOWEL.  10. Wear CLEAN PAJAMAS to bed the night before surgery  11. Place CLEAN SHEETS on your bed the night of your first shower and DO NOT SLEEP WITH PETS.  12. Wear comfortable clothes the morning of surgery.     Day of Surgery:  Please shower the morning of surgery with the CHG soap Do not apply any deodorants/lotions. Please wear clean clothes to the hospital/surgery center.   Remember to brush your teeth WITH YOUR REGULAR TOOTHPASTE.   Please read over the following fact sheets that you were given.

## 2019-10-16 NOTE — Progress Notes (Signed)
PCP - Dr. Redmond Pulling Cardiologist - pt denies   Chest x-ray - n/a EKG - 10/16/19 Stress Test - pt denies ECHO - pt denies Cardiac Cath - pt denies  Aspirin Instructions: Follow your surgeon's instructions on when to stop Aspirin.  If no instructions were given by your surgeon then you will need to call the office to get those instructions.    per pt LD ASA 10/12/19   ERAS Protcol - yes PRE-SURGERY Ensure or G2- ensure  COVID TEST- 10/16/19 after PAT appt   Anesthesia review: n/a  Patient denies shortness of breath, fever, cough and chest pain at PAT appointment   All instructions explained to the patient, with a verbal understanding of the material. Patient agrees to go over the instructions while at home for a better understanding. Patient also instructed to self quarantine after being tested for COVID-19. The opportunity to ask questions was provided.

## 2019-10-17 NOTE — Anesthesia Preprocedure Evaluation (Addendum)
Anesthesia Evaluation  Patient identified by MRN, date of birth, ID band Patient awake    Reviewed: Allergy & Precautions, NPO status , Patient's Chart, lab work & pertinent test results  History of Anesthesia Complications Negative for: history of anesthetic complications  Airway Mallampati: II  TM Distance: >3 FB Neck ROM: Full    Dental  (+) Teeth Intact   Pulmonary neg pulmonary ROS, former smoker,    Pulmonary exam normal        Cardiovascular hypertension, Normal cardiovascular exam     Neuro/Psych PSYCHIATRIC DISORDERS Anxiety negative neurological ROS     GI/Hepatic Neg liver ROS, GERD  ,  Endo/Other  negative endocrine ROS  Renal/GU negative Renal ROS  negative genitourinary   Musculoskeletal negative musculoskeletal ROS (+)   Abdominal   Peds  Hematology negative hematology ROS (+)   Anesthesia Other Findings  Lumbar spinal stenosis  Reproductive/Obstetrics                           Anesthesia Physical Anesthesia Plan  ASA: III  Anesthesia Plan: General   Post-op Pain Management:    Induction: Intravenous  PONV Risk Score and Plan: 2 and Ondansetron, Dexamethasone, Treatment may vary due to age or medical condition and Midazolam  Airway Management Planned: Oral ETT  Additional Equipment: None  Intra-op Plan:   Post-operative Plan: Extubation in OR  Informed Consent: I have reviewed the patients History and Physical, chart, labs and discussed the procedure including the risks, benefits and alternatives for the proposed anesthesia with the patient or authorized representative who has indicated his/her understanding and acceptance.     Dental advisory given  Plan Discussed with:   Anesthesia Plan Comments: (Admitted in 2018 for syncope due to transient CHB, evaluated by EP cardiology, felt secondary to tizanidine. No further events since stopping the med. Chronic  RBBB noted on EKG.   Preop labs reviewed, unremarkable. )      Anesthesia Quick Evaluation

## 2019-10-19 ENCOUNTER — Ambulatory Visit (HOSPITAL_COMMUNITY): Payer: Medicare Other | Admitting: Physician Assistant

## 2019-10-19 ENCOUNTER — Ambulatory Visit (HOSPITAL_COMMUNITY)
Admission: RE | Disposition: A | Discharge status: Home / Self Care | Payer: Self-pay | Source: Home / Self Care | Attending: Orthopedic Surgery

## 2019-10-19 ENCOUNTER — Ambulatory Visit (HOSPITAL_COMMUNITY)
Admission: RE | Admit: 2019-10-19 | Discharge: 2019-10-19 | Disposition: A | Discharge status: Home / Self Care | Payer: Medicare Other | Attending: Orthopedic Surgery | Admitting: Orthopedic Surgery

## 2019-10-19 ENCOUNTER — Other Ambulatory Visit: Payer: Self-pay

## 2019-10-19 ENCOUNTER — Ambulatory Visit (HOSPITAL_COMMUNITY): Payer: Medicare Other

## 2019-10-19 ENCOUNTER — Ambulatory Visit (HOSPITAL_COMMUNITY): Payer: Medicare Other | Admitting: Certified Registered"

## 2019-10-19 ENCOUNTER — Encounter (HOSPITAL_COMMUNITY): Payer: Self-pay | Admitting: Orthopedic Surgery

## 2019-10-19 DIAGNOSIS — I10 Essential (primary) hypertension: Secondary | ICD-10-CM | POA: Insufficient documentation

## 2019-10-19 DIAGNOSIS — Z791 Long term (current) use of non-steroidal anti-inflammatories (NSAID): Secondary | ICD-10-CM | POA: Insufficient documentation

## 2019-10-19 DIAGNOSIS — F419 Anxiety disorder, unspecified: Secondary | ICD-10-CM | POA: Diagnosis not present

## 2019-10-19 DIAGNOSIS — Z79899 Other long term (current) drug therapy: Secondary | ICD-10-CM | POA: Insufficient documentation

## 2019-10-19 DIAGNOSIS — Z7982 Long term (current) use of aspirin: Secondary | ICD-10-CM | POA: Insufficient documentation

## 2019-10-19 DIAGNOSIS — M4800 Spinal stenosis, site unspecified: Secondary | ICD-10-CM

## 2019-10-19 DIAGNOSIS — Z20822 Contact with and (suspected) exposure to covid-19: Secondary | ICD-10-CM | POA: Insufficient documentation

## 2019-10-19 DIAGNOSIS — K219 Gastro-esophageal reflux disease without esophagitis: Secondary | ICD-10-CM | POA: Insufficient documentation

## 2019-10-19 DIAGNOSIS — Z87891 Personal history of nicotine dependence: Secondary | ICD-10-CM | POA: Insufficient documentation

## 2019-10-19 DIAGNOSIS — M48062 Spinal stenosis, lumbar region with neurogenic claudication: Secondary | ICD-10-CM | POA: Diagnosis not present

## 2019-10-19 DIAGNOSIS — Z85820 Personal history of malignant melanoma of skin: Secondary | ICD-10-CM | POA: Insufficient documentation

## 2019-10-19 DIAGNOSIS — M1990 Unspecified osteoarthritis, unspecified site: Secondary | ICD-10-CM | POA: Insufficient documentation

## 2019-10-19 HISTORY — PX: LUMBAR LAMINECTOMY/DECOMPRESSION MICRODISCECTOMY: SHX5026

## 2019-10-19 IMAGING — CR DG LUMBAR SPINE 2-3V
1 series · 1 of 1 positions shown · non-contrast
Comparison: MRI [DATE].

CLINICAL DATA: Lumbar decompression.

EXAM:
LUMBAR SPINE - 2-3 VIEW

[lateral]
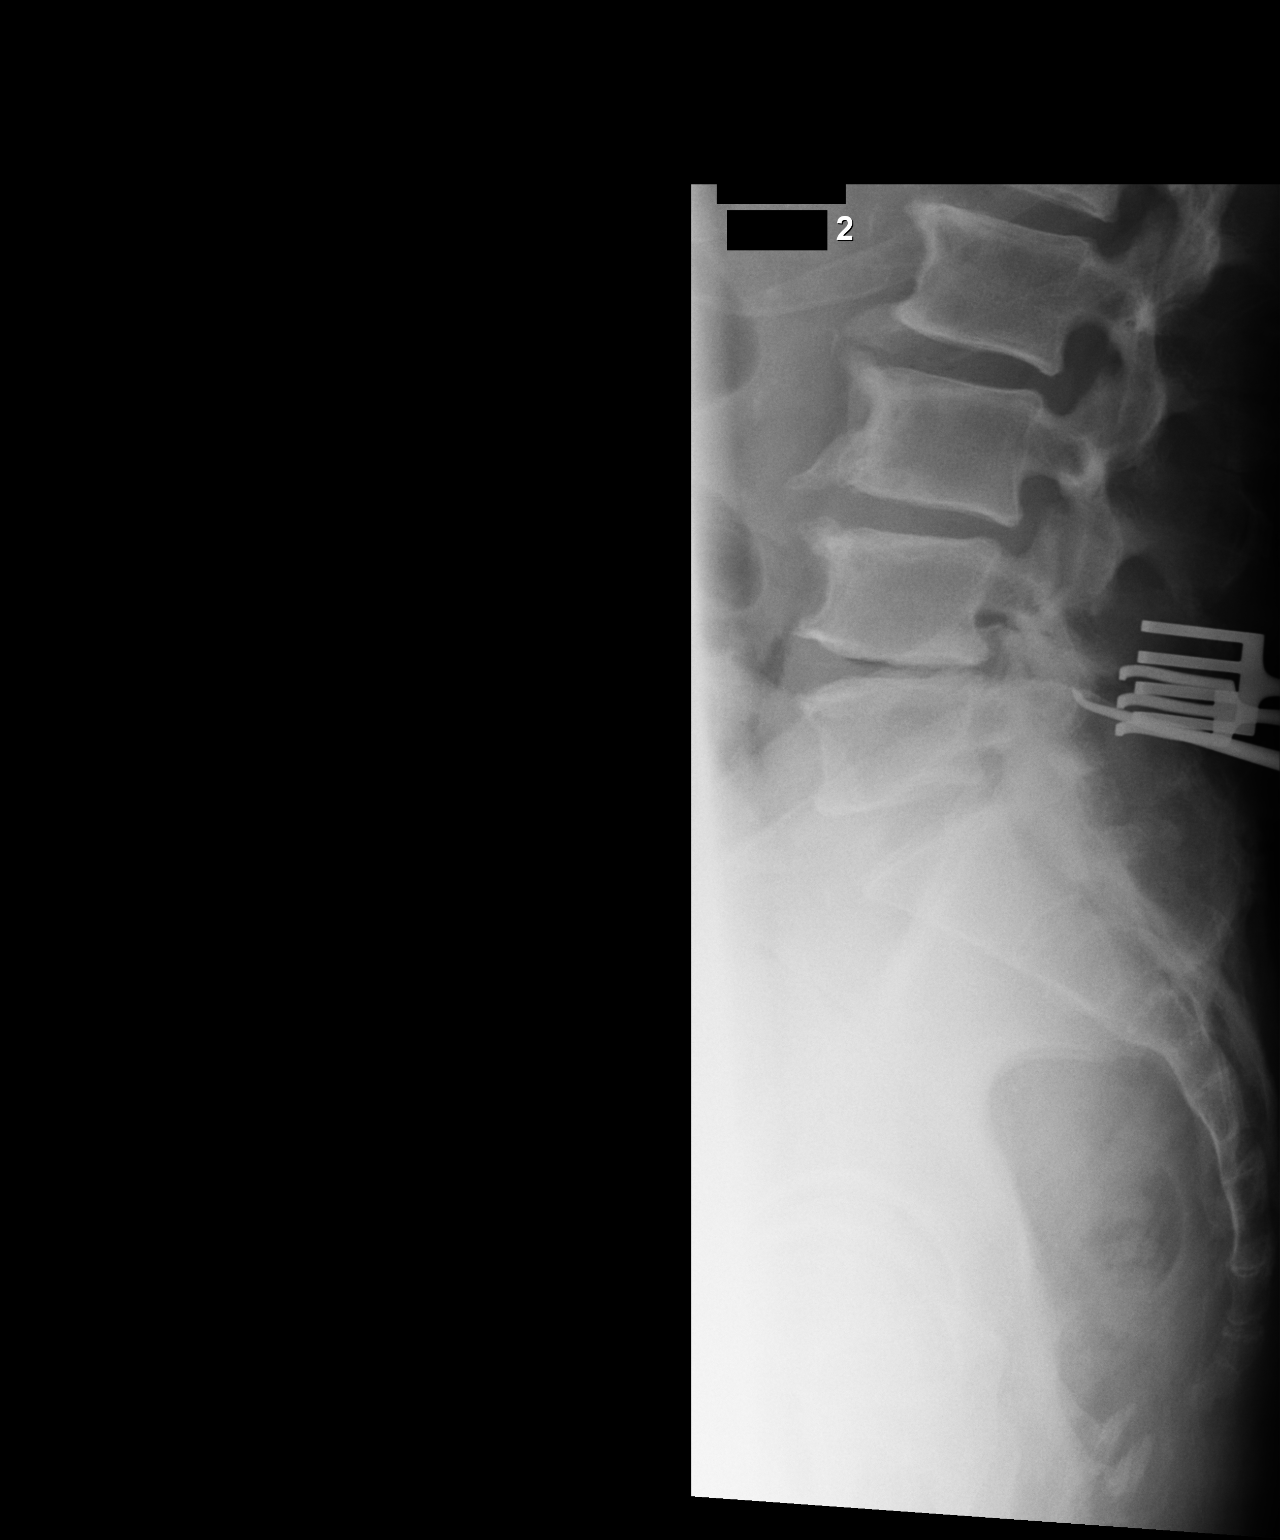

[1 of 1 positions shown; findings below may reference images not displayed]

FINDINGS: Lumbar spine numbered the lowest segmented appearing lumbar shaped
vertebrae on lateral view as L5. Diffuse multilevel degenerative
change. No acute bony abnormality identified. Metallic marker noted
posteriorly at L4-L5 on image number 2.
IMPRESSION: Metallic marker noted posteriorly L4-L5 on image 2.

## 2019-10-19 SURGERY — LUMBAR LAMINECTOMY/DECOMPRESSION MICRODISCECTOMY
Anesthesia: General

## 2019-10-19 MED ORDER — METHYLENE BLUE 0.5 % INJ SOLN
INTRAVENOUS | Status: AC
  Filled 2019-10-19: qty 10

## 2019-10-19 MED ORDER — POVIDONE-IODINE 7.5 % EX SOLN: Freq: Once | CUTANEOUS | Status: DC

## 2019-10-19 MED ORDER — EPHEDRINE SULFATE-NACL 50-0.9 MG/10ML-% IV SOSY
PREFILLED_SYRINGE | INTRAVENOUS | Status: DC | PRN
  Administered 2019-10-19: 15 mg via INTRAVENOUS

## 2019-10-19 MED ORDER — ONDANSETRON HCL 4 MG/2ML IJ SOLN: 4.0000 mg | Freq: Once | INTRAMUSCULAR | Status: DC | PRN

## 2019-10-19 MED ORDER — FENTANYL CITRATE (PF) 100 MCG/2ML IJ SOLN
INTRAMUSCULAR | Status: DC | PRN
  Administered 2019-10-19 (×2): 25 ug via INTRAVENOUS
  Administered 2019-10-19 (×2): 50 ug via INTRAVENOUS

## 2019-10-19 MED ORDER — PHENYLEPHRINE HCL-NACL 10-0.9 MG/250ML-% IV SOLN
INTRAVENOUS | Status: DC | PRN
  Administered 2019-10-19: 25 ug/min via INTRAVENOUS

## 2019-10-19 MED ORDER — PROPOFOL 10 MG/ML IV BOLUS
INTRAVENOUS | Status: DC | PRN
  Administered 2019-10-19: 130 mg via INTRAVENOUS

## 2019-10-19 MED ORDER — MINERAL OIL LIGHT OIL
TOPICAL_OIL | Freq: Once | Status: DC
  Filled 2019-10-19: qty 10

## 2019-10-19 MED ORDER — THROMBIN (RECOMBINANT) 20000 UNITS EX SOLR
CUTANEOUS | Status: AC
  Filled 2019-10-19: qty 20000

## 2019-10-19 MED ORDER — BUPIVACAINE LIPOSOME 1.3 % IJ SUSP
INTRAMUSCULAR | Status: DC | PRN
  Administered 2019-10-19: 20 mL

## 2019-10-19 MED ORDER — EPINEPHRINE PF 1 MG/ML IJ SOLN
INTRAMUSCULAR | Status: AC
  Filled 2019-10-19: qty 1

## 2019-10-19 MED ORDER — BUPIVACAINE HCL (PF) 0.25 % IJ SOLN
INTRAMUSCULAR | Status: AC
  Filled 2019-10-19: qty 30

## 2019-10-19 MED ORDER — METHYLPREDNISOLONE ACETATE 40 MG/ML IJ SUSP
INTRAMUSCULAR | Status: DC | PRN
  Administered 2019-10-19: 40 mg

## 2019-10-19 MED ORDER — SUGAMMADEX SODIUM 200 MG/2ML IV SOLN
INTRAVENOUS | Status: DC | PRN
  Administered 2019-10-19: 140 mg via INTRAVENOUS

## 2019-10-19 MED ORDER — HEMOSTATIC AGENTS (NO CHARGE) OPTIME
TOPICAL | Status: DC | PRN
  Administered 2019-10-19 (×2): 1 via TOPICAL

## 2019-10-19 MED ORDER — OXYCODONE-ACETAMINOPHEN 5-325 MG PO TABS: 1.0000 | ORAL_TABLET | ORAL | 0 refills | Status: AC | PRN

## 2019-10-19 MED ORDER — FENTANYL CITRATE (PF) 250 MCG/5ML IJ SOLN
INTRAMUSCULAR | Status: AC
  Filled 2019-10-19: qty 5

## 2019-10-19 MED ORDER — VANCOMYCIN HCL IN DEXTROSE 1-5 GM/200ML-% IV SOLN
1000.0000 mg | INTRAVENOUS | Status: AC
  Administered 2019-10-19: 1000 mg via INTRAVENOUS
  Filled 2019-10-19: qty 200

## 2019-10-19 MED ORDER — FENTANYL CITRATE (PF) 100 MCG/2ML IJ SOLN: 25.0000 ug | INTRAMUSCULAR | Status: DC | PRN

## 2019-10-19 MED ORDER — OXYCODONE HCL 5 MG PO TABS
ORAL_TABLET | ORAL | Status: AC
  Filled 2019-10-19: qty 1

## 2019-10-19 MED ORDER — ALBUMIN HUMAN 5 % IV SOLN: INTRAVENOUS | Status: DC | PRN

## 2019-10-19 MED ORDER — METHYLENE BLUE 0.5 % INJ SOLN
INTRAVENOUS | Status: DC | PRN
  Administered 2019-10-19: 1 mL

## 2019-10-19 MED ORDER — OXYCODONE HCL 5 MG PO TABS
5.0000 mg | ORAL_TABLET | Freq: Once | ORAL | Status: AC | PRN
  Administered 2019-10-19: 5 mg via ORAL

## 2019-10-19 MED ORDER — LACTATED RINGERS IV SOLN: INTRAVENOUS | Status: DC

## 2019-10-19 MED ORDER — EPHEDRINE 5 MG/ML INJ
INTRAVENOUS | Status: AC
  Filled 2019-10-19: qty 10

## 2019-10-19 MED ORDER — DEXAMETHASONE SODIUM PHOSPHATE 10 MG/ML IJ SOLN
INTRAMUSCULAR | Status: AC
  Filled 2019-10-19: qty 1

## 2019-10-19 MED ORDER — PHENYLEPHRINE 40 MCG/ML (10ML) SYRINGE FOR IV PUSH (FOR BLOOD PRESSURE SUPPORT)
PREFILLED_SYRINGE | INTRAVENOUS | Status: AC
  Filled 2019-10-19: qty 10

## 2019-10-19 MED ORDER — DEXAMETHASONE SODIUM PHOSPHATE 10 MG/ML IJ SOLN
INTRAMUSCULAR | Status: DC | PRN
  Administered 2019-10-19: 4 mg via INTRAVENOUS

## 2019-10-19 MED ORDER — METHOCARBAMOL 500 MG PO TABS: 500.0000 mg | ORAL_TABLET | Freq: Four times a day (QID) | ORAL | 0 refills | Status: DC | PRN

## 2019-10-19 MED ORDER — THROMBIN 20000 UNITS EX SOLR
CUTANEOUS | Status: DC | PRN
  Administered 2019-10-19: 20000 [IU] via TOPICAL

## 2019-10-19 MED ORDER — 0.9 % SODIUM CHLORIDE (POUR BTL) OPTIME
TOPICAL | Status: DC | PRN
  Administered 2019-10-19 (×3): 1000 mL

## 2019-10-19 MED ORDER — PHENYLEPHRINE 40 MCG/ML (10ML) SYRINGE FOR IV PUSH (FOR BLOOD PRESSURE SUPPORT)
PREFILLED_SYRINGE | INTRAVENOUS | Status: DC | PRN
  Administered 2019-10-19 (×2): 120 ug via INTRAVENOUS

## 2019-10-19 MED ORDER — OXYCODONE HCL 5 MG/5ML PO SOLN: 5.0000 mg | Freq: Once | ORAL | Status: AC | PRN

## 2019-10-19 MED ORDER — ROCURONIUM BROMIDE 10 MG/ML (PF) SYRINGE
PREFILLED_SYRINGE | INTRAVENOUS | Status: DC | PRN
  Administered 2019-10-19: 10 mg via INTRAVENOUS
  Administered 2019-10-19: 70 mg via INTRAVENOUS
  Administered 2019-10-19: 10 mg via INTRAVENOUS

## 2019-10-19 MED ORDER — ONDANSETRON HCL 4 MG/2ML IJ SOLN
INTRAMUSCULAR | Status: DC | PRN
  Administered 2019-10-19: 4 mg via INTRAVENOUS

## 2019-10-19 MED ORDER — METHYLPREDNISOLONE ACETATE 40 MG/ML IJ SUSP
INTRAMUSCULAR | Status: AC
  Filled 2019-10-19: qty 1

## 2019-10-19 MED ORDER — ROCURONIUM BROMIDE 10 MG/ML (PF) SYRINGE
PREFILLED_SYRINGE | INTRAVENOUS | Status: AC
  Filled 2019-10-19: qty 10

## 2019-10-19 MED ORDER — ONDANSETRON HCL 4 MG/2ML IJ SOLN
INTRAMUSCULAR | Status: AC
  Filled 2019-10-19: qty 2

## 2019-10-19 MED ORDER — PROPOFOL 10 MG/ML IV BOLUS
INTRAVENOUS | Status: AC
  Filled 2019-10-19: qty 20

## 2019-10-19 MED ORDER — LIDOCAINE 2% (20 MG/ML) 5 ML SYRINGE
INTRAMUSCULAR | Status: AC
  Filled 2019-10-19: qty 5

## 2019-10-19 MED ORDER — BUPIVACAINE-EPINEPHRINE 0.25% -1:200000 IJ SOLN
INTRAMUSCULAR | Status: DC | PRN
  Administered 2019-10-19: 30 mL

## 2019-10-19 MED ORDER — BUPIVACAINE LIPOSOME 1.3 % IJ SUSP
20.0000 mL | Freq: Once | INTRAMUSCULAR | Status: DC
  Filled 2019-10-19: qty 20

## 2019-10-19 SURGICAL SUPPLY — 77 items
BENZOIN TINCTURE PRP APPL 2/3 (GAUZE/BANDAGES/DRESSINGS) ×2 IMPLANT
BUR PRECISION FLUTE 5.0 (BURR) ×3 IMPLANT
CABLE BIPOLOR RESECTION CORD (MISCELLANEOUS) ×3 IMPLANT
CANISTER SUCT 3000ML PPV (MISCELLANEOUS) ×3 IMPLANT
CARTRIDGE OIL MAESTRO DRILL (MISCELLANEOUS) ×1 IMPLANT
CLOSURE STERI-STRIP 1/2X4 (GAUZE/BANDAGES/DRESSINGS) ×1
CLOSURE WOUND 1/2 X4 (GAUZE/BANDAGES/DRESSINGS)
CLSR STERI-STRIP ANTIMIC 1/2X4 (GAUZE/BANDAGES/DRESSINGS) ×1 IMPLANT
COVER SURGICAL LIGHT HANDLE (MISCELLANEOUS) ×3 IMPLANT
COVER WAND RF STERILE (DRAPES) ×3 IMPLANT
DIFFUSER DRILL AIR PNEUMATIC (MISCELLANEOUS) ×3 IMPLANT
DRAIN CHANNEL 15F RND FF W/TCR (WOUND CARE) IMPLANT
DRAPE POUCH INSTRU U-SHP 10X18 (DRAPES) ×6 IMPLANT
DRAPE SURG 17X23 STRL (DRAPES) ×12 IMPLANT
DURAPREP 26ML APPLICATOR (WOUND CARE) ×3 IMPLANT
ELECT BLADE 4.0 EZ CLEAN MEGAD (MISCELLANEOUS) ×3
ELECT CAUTERY BLADE 6.4 (BLADE) ×3 IMPLANT
ELECT REM PT RETURN 9FT ADLT (ELECTROSURGICAL) ×3
ELECTRODE BLDE 4.0 EZ CLN MEGD (MISCELLANEOUS) ×1 IMPLANT
ELECTRODE REM PT RTRN 9FT ADLT (ELECTROSURGICAL) ×1 IMPLANT
EVACUATOR SILICONE 100CC (DRAIN) IMPLANT
FILTER STRAW FLUID ASPIR (MISCELLANEOUS) ×3 IMPLANT
GAUZE 4X4 16PLY RFD (DISPOSABLE) ×6 IMPLANT
GAUZE SPONGE 4X4 12PLY STRL (GAUZE/BANDAGES/DRESSINGS) ×3 IMPLANT
GLOVE BIO SURGEON STRL SZ7 (GLOVE) ×3 IMPLANT
GLOVE BIO SURGEON STRL SZ8 (GLOVE) ×3 IMPLANT
GLOVE BIOGEL PI IND STRL 7.0 (GLOVE) ×1 IMPLANT
GLOVE BIOGEL PI IND STRL 8 (GLOVE) ×1 IMPLANT
GLOVE BIOGEL PI INDICATOR 7.0 (GLOVE) ×2
GLOVE BIOGEL PI INDICATOR 8 (GLOVE) ×2
GOWN STRL REUS W/ TWL LRG LVL3 (GOWN DISPOSABLE) ×1 IMPLANT
GOWN STRL REUS W/ TWL XL LVL3 (GOWN DISPOSABLE) ×2 IMPLANT
GOWN STRL REUS W/TWL LRG LVL3 (GOWN DISPOSABLE) ×3
GOWN STRL REUS W/TWL XL LVL3 (GOWN DISPOSABLE) ×6
IV CATH 14GX2 1/4 (CATHETERS) ×3 IMPLANT
KIT BASIN OR (CUSTOM PROCEDURE TRAY) ×3 IMPLANT
KIT POSITION SURG JACKSON T1 (MISCELLANEOUS) ×3 IMPLANT
KIT TURNOVER KIT B (KITS) ×3 IMPLANT
NDL 18GX1X1/2 (RX/OR ONLY) (NEEDLE) ×1 IMPLANT
NDL HYPO 25GX1X1/2 BEV (NEEDLE) ×1 IMPLANT
NDL SPNL 18GX3.5 QUINCKE PK (NEEDLE) ×2 IMPLANT
NEEDLE 18GX1X1/2 (RX/OR ONLY) (NEEDLE) ×3 IMPLANT
NEEDLE 22X1 1/2 (OR ONLY) (NEEDLE) ×3 IMPLANT
NEEDLE HYPO 25GX1X1/2 BEV (NEEDLE) ×3 IMPLANT
NEEDLE SPNL 18GX3.5 QUINCKE PK (NEEDLE) ×6 IMPLANT
NS IRRIG 1000ML POUR BTL (IV SOLUTION) ×5 IMPLANT
OIL CARTRIDGE MAESTRO DRILL (MISCELLANEOUS) ×3
PACK LAMINECTOMY ORTHO (CUSTOM PROCEDURE TRAY) ×3 IMPLANT
PACK UNIVERSAL I (CUSTOM PROCEDURE TRAY) ×3 IMPLANT
PAD ARMBOARD 7.5X6 YLW CONV (MISCELLANEOUS) ×6 IMPLANT
PATTIES SURGICAL .5 X.5 (GAUZE/BANDAGES/DRESSINGS) ×2 IMPLANT
PATTIES SURGICAL .5 X1 (DISPOSABLE) ×3 IMPLANT
SPONGE INTESTINAL PEANUT (DISPOSABLE) ×3 IMPLANT
SPONGE LAP 4X18 RFD (DISPOSABLE) ×2 IMPLANT
SPONGE SURGIFOAM ABS GEL 100 (HEMOSTASIS) ×3 IMPLANT
SPONGE SURGIFOAM ABS GEL SZ50 (HEMOSTASIS) ×3 IMPLANT
STRIP CLOSURE SKIN 1/2X4 (GAUZE/BANDAGES/DRESSINGS) IMPLANT
SURGIFLO W/THROMBIN 8M KIT (HEMOSTASIS) IMPLANT
SUT BONE WAX W31G (SUTURE) ×2 IMPLANT
SUT MNCRL AB 4-0 PS2 18 (SUTURE) ×3 IMPLANT
SUT VIC AB 0 CT1 18XCR BRD 8 (SUTURE) IMPLANT
SUT VIC AB 0 CT1 27 (SUTURE)
SUT VIC AB 0 CT1 27XBRD ANBCTR (SUTURE) IMPLANT
SUT VIC AB 0 CT1 8-18 (SUTURE)
SUT VIC AB 1 CT1 18XCR BRD 8 (SUTURE) ×1 IMPLANT
SUT VIC AB 1 CT1 8-18 (SUTURE) ×3
SUT VIC AB 2-0 CT2 18 VCP726D (SUTURE) ×5 IMPLANT
SYR 20ML LL LF (SYRINGE) ×3 IMPLANT
SYR BULB IRRIGATION 50ML (SYRINGE) ×3 IMPLANT
SYR CONTROL 10ML LL (SYRINGE) ×6 IMPLANT
SYR TB 1ML 25GX5/8 (SYRINGE) ×6 IMPLANT
SYR TB 1ML LUER SLIP (SYRINGE) ×6 IMPLANT
TAPE CLOTH SURG 6X10 WHT LF (GAUZE/BANDAGES/DRESSINGS) ×2 IMPLANT
TOWEL GREEN STERILE (TOWEL DISPOSABLE) ×3 IMPLANT
TOWEL GREEN STERILE FF (TOWEL DISPOSABLE) ×3 IMPLANT
WATER STERILE IRR 1000ML POUR (IV SOLUTION) ×3 IMPLANT
YANKAUER SUCT BULB TIP NO VENT (SUCTIONS) ×3 IMPLANT

## 2019-10-19 NOTE — H&P (Signed)
PREOPERATIVE H&P  Chief Complaint: Bilateral leg pain  HPI: Corey [name] is a 86 y.o. male who presents with ongoing pain in the bilateral legs  MRI reveals severe spinal stenosis spanning L3-C5  Patient has failed multiple forms of conservative care and continues to have pain (see office notes for additional details regarding the patient's full course of treatment)  Past Medical History:  Diagnosis Date  . Allergic asthma 1957   "had to leave [place]"  . Anxiety   . Arthritis    "neck, hips" (02/17/2017)  . Chronic cervical pain   . Chronic hip pain    "both hips"  . Chronic lower back pain   . Daily headache    "fairly constant; caused by spurs in my neck" (02/17/2017)  . GERD (gastroesophageal reflux disease)   . Hypertension   . Prostatitis   . Spinal stenosis    Past Surgical History:  Procedure Laterality Date  . MELANOMA EXCISION     "pre-melanoma taken off my back"  . TONSILLECTOMY     Social History   Socioeconomic History  . Marital status: Married    Spouse name: Not on file  . Number of children: 1  . Years of education: 41  . Highest education level: Not on file  Occupational History  . Not on file  Tobacco Use  . Smoking status: Former Smoker    Years: 2.00    Types: Cigars    Quit date: 1963    Years since quitting: 58.2  . Smokeless tobacco: Never Used  Substance and Sexual Activity  . Alcohol use: No  . Drug use: No  . Sexual activity: Yes  Other Topics Concern  . Not on file  Social History Narrative   Lives with spouse   Caffeine use: 1 cup tea per day   Right handed    Social Determinants of Health   Financial Resource Strain:   . Difficulty of Paying Living Expenses:   Food Insecurity:   . Worried About Charity fundraiser in the Last Year:   . Arboriculturist in the Last Year:   Transportation Needs:   . Film/video editor (Medical):   Marland Kitchen Lack of Transportation (Non-Medical):   Physical Activity:   . Days  of Exercise per Week:   . Minutes of Exercise per Session:   Stress:   . Feeling of Stress :   Social Connections:   . Frequency of Communication with Friends and Family:   . Frequency of Social Gatherings with Friends and Family:   . Attends Religious Services:   . Active Member of Clubs or Organizations:   . Attends Archivist Meetings:   Marland Kitchen Marital Status:    Family History  Problem Relation Age of Onset  . Hypertension Father    Allergies  Allergen Reactions  . Tizanidine Anaphylaxis and Other (See Comments)    Complete heart block, syncope   . Zanaflex [Tizanidine Hcl] Other (See Comments)    Severe bradycardia   . Gabapentin Other (See Comments)    Shaking chills At 300 mg bid  . Ciprofloxacin Itching and Rash  . Penicillins Itching and Rash    Has patient had a PCN reaction causing immediate rash, facial/tongue/throat swelling, SOB or lightheadedness with hypotension:yes Has patient had a PCN reaction causing severe rash involving mucus membranes or skin necrosis:no Has patient had a PCN reaction that required hospitalization:no Has patient had a PCN reaction occurring within  the last 10 years:no If all of the above answers are "NO", then may proceed with Cephalosporin use.   . Sulfa Antibiotics Itching and Rash   Prior to Admission medications   Medication Sig Start Date End Date Taking? Authorizing Provider  aspirin EC 81 MG tablet Take 81 mg by mouth every evening.    Yes [provider]  Glucosamine HCl (GLUCOSAMINE PO) Take 1,500 mg by mouth 2 (two) times daily.    Yes [provider]  LORazepam (ATIVAN) 0.5 MG tablet Take 0.25 mg by mouth 2 (two) times daily as needed for anxiety. Max 0.75 mg daily   Yes [provider]  losartan (COZAAR) 100 MG tablet Take 1 tablet (100 mg total) by mouth daily. 04/16/17  Yes Baldwin Jamaica, PA-C  naproxen (NAPROSYN) 375 MG tablet Take 375 mg by mouth daily as needed for mild pain.   Yes  [provider]  Potassium 99 MG TABS Take 99 mg by mouth daily after lunch.    Yes [provider]  tamsulosin (FLOMAX) 0.4 MG CAPS capsule Take 0.4 mg by mouth at bedtime.  07/30/15  Yes [provider]  Turmeric 500 MG TABS Take 500 mg by mouth daily.   Yes [provider]  vitamin C (ASCORBIC ACID) 500 MG tablet Take 500 mg by mouth 2 (two) times daily.   Yes [provider]  DULoxetine (CYMBALTA) 30 MG capsule 1 capsule daily for 2 weeks then take 1 twice daily Patient not taking: Reported on 10/09/2019 03/21/18   Kathrynn Ducking, MD     All other systems have been reviewed and were otherwise negative with the exception of those mentioned in the HPI and as above.  Physical Exam: Vitals:   10/19/19 1045 10/19/19 1102  BP: (!) 209/86 (!) 194/82  Pulse: 90   Resp: 18   Temp: 98 F (36.7 C)   SpO2: 100%     Body mass index is 25.46 kg/m.  General: Alert, no acute distress Cardiovascular: No pedal edema Respiratory: No cyanosis, no use of accessory musculature Skin: No lesions in the area of chief complaint Neurologic: Sensation intact distally Psychiatric: Patient is competent for consent with normal mood and affect Lymphatic: No axillary or cervical lymphadenopathy  Assessment/Plan: SEVERE SPINAL STENOSIS SPANNING LUMBAR 3 - LUMBAR 5  Plan for Procedure(s): LUMBAR 3- LUMBAR 5 DECOMPRESSION   Norva Karvonen, MD 10/19/2019 11:03 AM

## 2019-10-19 NOTE — Discharge Instructions (Addendum)
Spinal Stenosis  Spinal stenosis occurs when the open space (spinal canal) between the bones of your spine (vertebrae) narrows, putting pressure on the spinal cord or nerves. What are the causes? This condition is caused by areas of bone pushing into the central canals of your vertebrae. This condition may be present at birth (congenital), or it may be caused by:  Arthritic deterioration of your vertebrae (spinal degeneration). This usually starts around age 50.  Injury or trauma to the spine.  Tumors in the spine.  Calcium deposits in the spine. What are the signs or symptoms? Symptoms of this condition include:  Pain in the neck or back that is generally worse with activities, particularly when standing and walking.  Numbness, tingling, hot or cold sensations, weakness, or weariness in your legs.  Pain going up and down the leg (sciatica).  Frequent episodes of falling.  A foot-slapping gait that leads to muscle weakness. In more serious cases, you may develop:  Problemspassing stool or passing urine.  Difficulty having sex.  Loss of feeling in part or all of your leg. Symptoms may come on slowly and get worse over time. How is this diagnosed? This condition is diagnosed based on your medical history and a physical exam. Tests will also be done, such as:  MRI.  CT scan.  X-ray. How is this treated? Treatment for this condition often focuses on managing your pain and any other symptoms. Treatment may include:  Practicing good posture to lessen pressure on your nerves.  Exercising to strengthen muscles, build endurance, improve balance, and maintain good joint movement (range of motion).  Losing weight, if needed.  Taking medicines to reduce swelling, inflammation, or pain.  Assistive devices, such as a corset or brace. In some cases, surgery may be needed. The most common procedure is decompression laminectomy. This is done to remove excess bone that puts  pressure on your nerve roots. Follow these instructions at home: Managing pain, stiffness, and swelling  Do all exercises and stretches as told by your health care provider.  Practice good posture. If you were given a brace or a corset, wear it as told by your health care provider.  Do not do any activities that cause pain. Ask your health care provider what activities are safe for you.  Do not lift anything that is heavier than 10 lb (4.5 kg) or the limit that your health care provider tells you.  Maintain a healthy weight. Talk with your health care provider if you need help losing weight.  If directed, apply heat to the affected area as often as told by your health care provider. Use the heat source that your health care provider recommends, such as a moist heat pack or a heating pad. ? Place a towel between your skin and the heat source. ? Leave the heat on for 20-30 minutes. ? Remove the heat if your skin turns bright red. This is especially important if you are not able to feel pain, heat, or cold. You may have a greater risk of getting burned. General instructions  Take over-the-counter and prescription medicines only as told by your health care provider.  Do not use any products that contain nicotine or tobacco, such as cigarettes and e-cigarettes. If you need help quitting, ask your health care provider.  Eat a healthy diet. This includes plenty of fruits and vegetables, whole grains, and low-fat (lean) protein.  Keep all follow-up visits as told by your health care provider. This is important. Contact   a health care provider if:  Your symptoms do not get better or they get worse.  You have a fever. Get help right away if:  You have new or worse pain in your neck or upper back.  You have severe pain that cannot be controlled with medicines.  You are dizzy.  You have vision problems, blurred vision, or double vision.  You have a severe headache that is worse when you  stand.  You have nausea or you vomit.  You develop new or worse numbness or tingling in your back or legs.  You have pain, redness, swelling, or warmth in your arm or leg. Summary  Spinal stenosis occurs when the open space (spinal canal) between the bones of your spine (vertebrae) narrows. This narrowing puts pressure on the spinal cord or nerves.  Spinal stenosis can cause numbness, weakness, or pain in the neck, back, and legs.  This condition may be caused by a birth defect, arthritic deterioration of your vertebrae, injury, tumors, or calcium deposits.  This condition is usually diagnosed with MRIs, CT scans, and X-rays. This information is not intended to replace advice given to you by your health care provider. Make sure you discuss any questions you have with your health care provider. Document Revised: 06/25/2017 Document Reviewed: 06/17/2016 Elsevier Patient Education  2020 [employer]  

## 2019-10-19 NOTE — Op Note (Signed)
PATIENT NAME: Joziel Lagrone [facility]   MEDICAL RECORD NO.:   [id number]    DATE OF BIRTH: [birth year]   DATE OF PROCEDURE: 10/19/2019                               OPERATIVE REPORT   PREOPERATIVE DIAGNOSES: 1. Right leg pain. 2. Neurogenic claudication. 3. Severe spinal stenosis, L3/4, L4-5.  POSTOPERATIVE DIAGNOSES: 1. Right leg pain. 2. Neurogenic claudication. 3. Severe spinal stenosis, L3/4, L4-5.  PROCEDURE:  L3/4, L4-5 laminectomy with bilateral partial facetectomy and bilateral foraminotomy.  SURGEON:  Phylliss Bob, MD.  ASSISTANTPricilla Holm, PA-C.  ANESTHESIA:  General endotracheal anesthesia.  COMPLICATIONS:  None.  DISPOSITION:  Stable.  ESTIMATED BLOOD LOSS:  Minimal.  INDICATIONS FOR SURGERY:  Briefly, Mr. Kaylor is a very pleasant 86 year-old male, who did present to me with pain in the bilateral legs. The patient's MRI did reveal spinal stenosis at L3/4 and L4/5.  We did proceed with appropriate conservative treatment, but the patient did continue to have ongoing pain, which he did feel was limiting his function substantially.  Given the patient's ongoing pain and dysfunction, we did discuss proceeding with the procedure reflected above.  The patient was fully aware of the risks and limitations of surgery and did wish to proceed.  OPERATIVE DETAILS:  On 10/19/2019, the patient was brought to surgery and general endotracheal anesthesia was administered.  The patient was placed prone on a well-padded flat Jackson bed with a Wilson frame. Antibiotics were given and the back was prepped and draped in the usual sterile fashion.  A time-out procedure was performed.  I then made a midline incision overlying the L3/4 and L4/5 intervertebral spaces.  The fascia was incised at the midline.  The paraspinal musculature was bluntly retracted laterally and held retracted with a self-retaining retractor. After confirming the appropriate operative level, I did  remove the spinous process of L3 and L4.  At this point, I proceeded with a partial facetectomy on the right and left sides.  Of note, there was  significant overgrowth of the facet joints bilaterally, and there was also very substantial hypertrophy of the ligamentum flavum.  This was causing severe spinal stenosis.  The lateral recess stenosis was addressed by using Kerrison punches to thoroughly and decompress the right and left lateral recess.  At this point, I carried the decompression up to the L3-4 level.  Once again, a partial facetectomy was performed bilaterally, and I was able to thoroughly and completely decompress the right and left lateral recess and foramen on the right and left sides of the L3-4 level.  I then additionally decompress the right and left lateral recess and right and left neuroforamen at L4-5. At the termination of the decompression, I was able to easily pass a [facility] out the neuroforamen on the right and left sides at both L3-4 and L4-5. The spinal canal was entirely decompressed.  All bleeding was then adequately controlled.  At this point, 40 mg of Depo-Medrol was introduced about the epidural space.  Prior to this, the wound was copiously irrigated with a total of approximately 2 L of normal saline. Gelfoam was placed over the laminectomy site.  I was very pleased with the decompression.  There was no extravasation of cerebrospinal fluid noted throughout the entire surgery.  At this point, the wound was closed in layers using #1 Vicryl, followed by 2-0 Vicryl, followed by  4- 0 Monocryl.  Benzoin and Steri-Strips were applied, followed by a sterile dressing.  All instrument counts were correct at the termination of the procedure.  Of note, Pricilla Holm, PA-C, was my assistant throughout surgery, and did aid in retraction, suctioning, and closure from start to finish.   Phylliss Bob, MD

## 2019-10-19 NOTE — Progress Notes (Signed)
Dr. Christella Hartigan notified of BP 194/82. No new orders at this time.

## 2019-10-19 NOTE — Transfer of Care (Signed)
Immediate Anesthesia Transfer of Care Note  Patient: Corey [name]  Procedure(s) Performed: LUMBAR 3- LUMBAR 5 DECOMPRESSION (N/A )  Patient Location: PACU  Anesthesia Type:General  Level of Consciousness: drowsy and patient cooperative  Airway & Oxygen Therapy: Patient Spontanous Breathing  Post-op Assessment: Report given to RN and Patient moving all extremities X 4  Post vital signs: Reviewed and stable  Last Vitals:  Vitals Value Taken Time  BP    Temp    Pulse 79 10/19/19 1800  Resp 16 10/19/19 1800  SpO2 99 % 10/19/19 1800  Vitals shown include unvalidated device data.  Last Pain:  Vitals:   10/19/19 1102  PainSc: 0-No pain         Complications: No apparent anesthesia complications

## 2019-10-19 NOTE — Anesthesia Procedure Notes (Signed)
Procedure Name: Intubation Date/Time: 10/19/2019 1:43 PM Performed by: Barrington Ellison, CRNA Pre-anesthesia Checklist: Patient identified, Emergency Drugs available, Suction available and Patient being monitored Patient Re-evaluated:Patient Re-evaluated prior to induction Oxygen Delivery Method: Circle System Utilized Preoxygenation: Pre-oxygenation with 100% oxygen Induction Type: IV induction Ventilation: Mask ventilation without difficulty Laryngoscope Size: Mac and 4 Grade View: Grade II Tube type: Oral Tube size: 7.5 mm Number of attempts: 1 Airway Equipment and Method: Stylet and Oral airway Placement Confirmation: ETT inserted through vocal cords under direct vision,  positive ETCO2 and breath sounds checked- equal and bilateral Secured at: 22 cm Tube secured with: Tape Dental Injury: Teeth and Oropharynx as per pre-operative assessment

## 2019-10-20 MED FILL — Thrombin (Recombinant) For Soln 20000 Unit: CUTANEOUS | Qty: 1 | Status: AC

## 2019-10-20 NOTE — Anesthesia Postprocedure Evaluation (Signed)
Anesthesia Post Note  Patient: Corey [name]  Procedure(s) Performed: LUMBAR 3- LUMBAR 5 DECOMPRESSION (N/A )     Patient location during evaluation: PACU Anesthesia Type: General Level of consciousness: awake and alert Pain management: pain level controlled Vital Signs Assessment: post-procedure vital signs reviewed and stable Respiratory status: spontaneous breathing, nonlabored ventilation, respiratory function stable and patient connected to nasal cannula oxygen Cardiovascular status: blood pressure returned to baseline and stable Postop Assessment: no apparent nausea or vomiting Anesthetic complications: no    Last Vitals:  Vitals:   10/19/19 1915 10/19/19 1930  BP: (!) 175/71 (!) 173/71  Pulse: 81 79  Resp: 19 14  Temp:  (!) 36.2 C  SpO2: 97% 100%    Last Pain:  Vitals:   10/19/19 1930  PainSc: 3                  Alissah Redmon COKER

## 2019-11-09 DIAGNOSIS — F418 Other specified anxiety disorders: Secondary | ICD-10-CM | POA: Insufficient documentation

## 2020-02-08 ENCOUNTER — Other Ambulatory Visit: Payer: Self-pay | Admitting: Orthopedic Surgery

## 2020-02-09 ENCOUNTER — Other Ambulatory Visit: Payer: Self-pay | Admitting: Orthopedic Surgery

## 2020-02-09 DIAGNOSIS — M545 Low back pain, unspecified: Secondary | ICD-10-CM

## 2020-02-12 ENCOUNTER — Ambulatory Visit
Admission: RE | Admit: 2020-02-12 | Discharge: 2020-02-12 | Disposition: A | Discharge status: Home / Self Care | Payer: Medicare Other | Source: Ambulatory Visit | Attending: Orthopedic Surgery | Admitting: Orthopedic Surgery

## 2020-02-12 DIAGNOSIS — M545 Low back pain, unspecified: Secondary | ICD-10-CM

## 2020-02-12 IMAGING — MR MR LUMBAR SPINE W/O CM
5 series · 42 of 48 positions shown · non-contrast
Comparison: Radiographs [DATE].  MRI [DATE].

CLINICAL DATA: Low back and left hip pain. Back surgery 4 months
ago. History of spinal stenosis.

EXAM:
MRI LUMBAR SPINE WITHOUT CONTRAST
TECHNIQUE: Multiplanar, multisequence MR imaging of the lumbar spine was
performed. No intravenous contrast was administered.

[Series 3: tirm sag · sagittal · 4.0mm · 0.55mm/px · 6 of 13 slices shown]
[im 1/13]
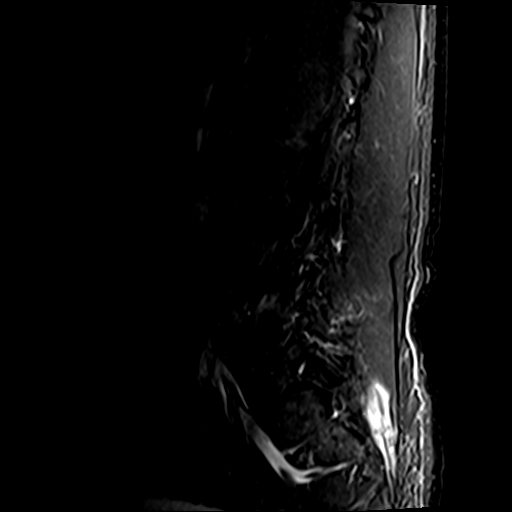
[im 3/13]
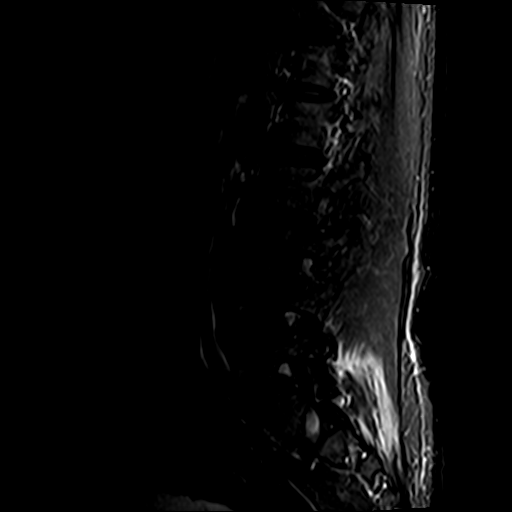
[im 5/13]
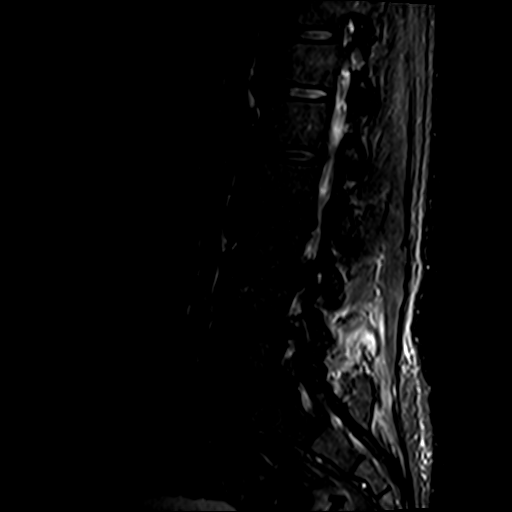
[im 8/13]
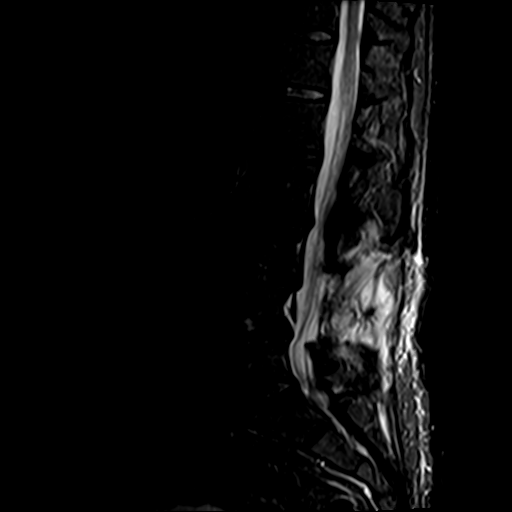
[im 10/13]
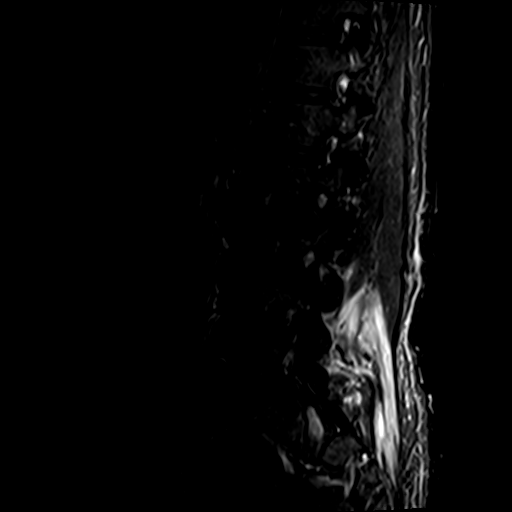
[im 13/13]
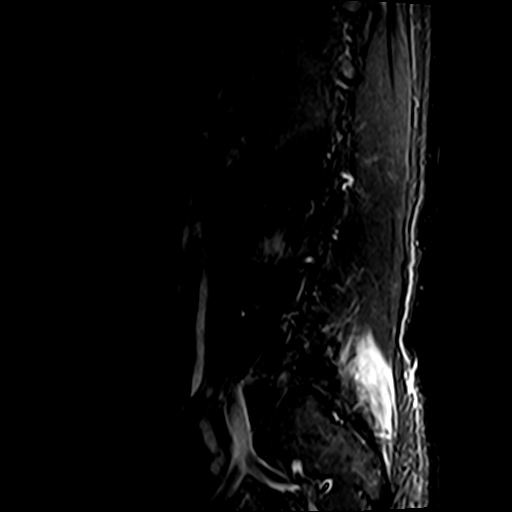

[Series 4: T2 · sagittal · 4.0mm · 0.88mm/px · 5 of 13 slices shown (1 of 2)]
[im 1/13]
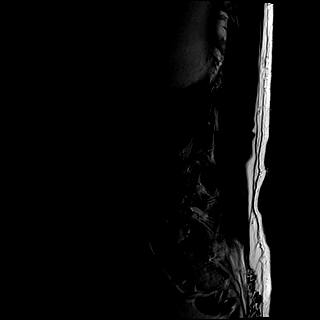
[im 4/13]
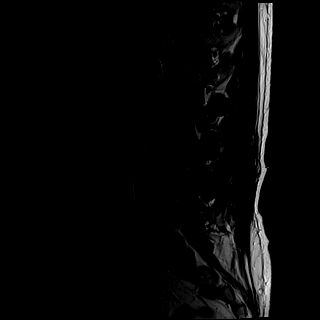
[im 7/13]
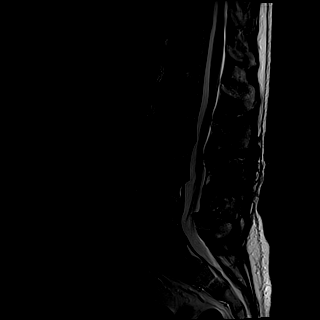
[im 10/13]
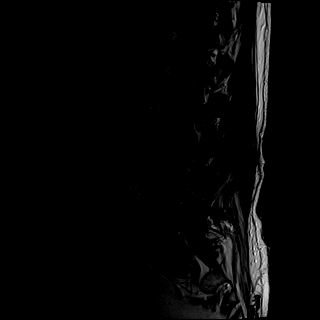
[im 13/13]
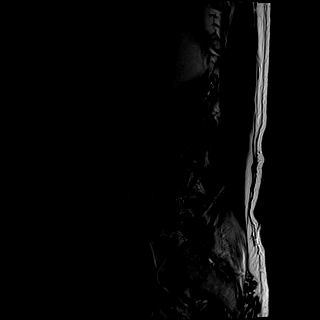

[Series 5: T1 · sagittal · 4.0mm · 0.88mm/px · 5 of 13 slices shown (1 of 2)]
[im 1/13]
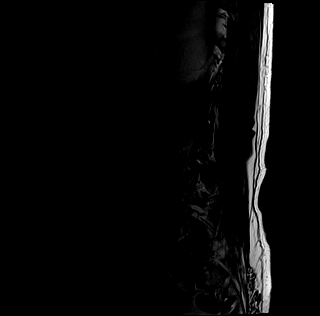
[im 4/13]
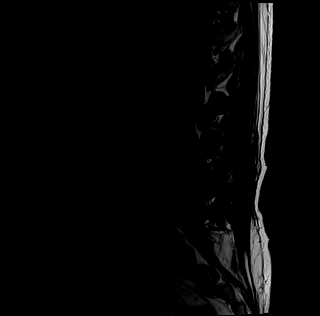
[im 7/13]
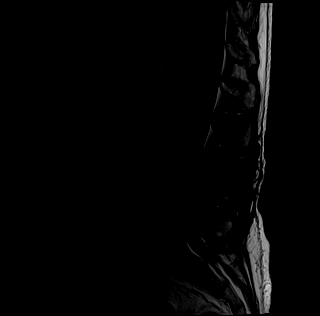
[im 10/13]
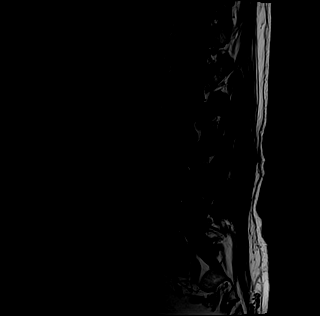
[im 13/13]
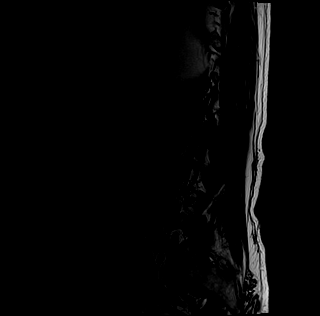

[Series 6: T1 · axial · 4.0mm · 0.78mm/px · z∈[-91,+102]mm · 10 of 39 slices shown (2 of 2)]
[im 3/39]
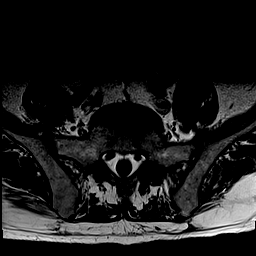
[im 6/39]
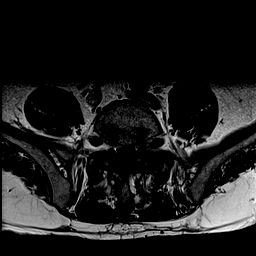
[im 8/39]
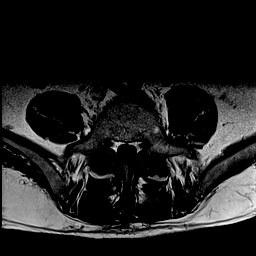
[im 13/39]
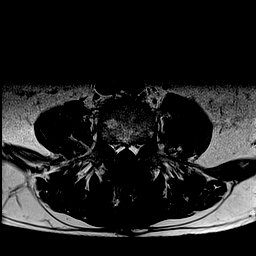
[im 18/39]
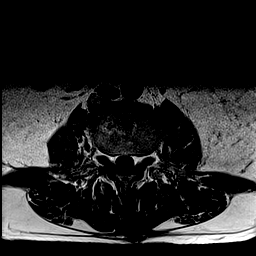
[im 21/39]
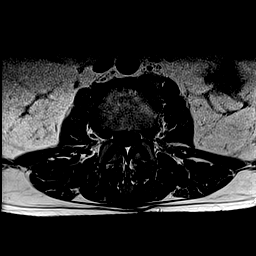
[im 23/39]
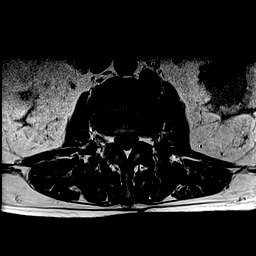
[im 28/39]
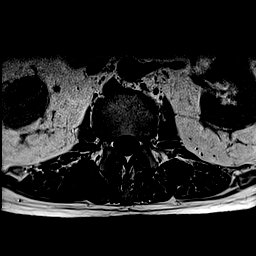
[im 33/39]
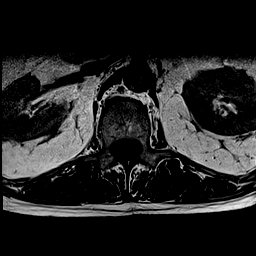
[im 39/39]
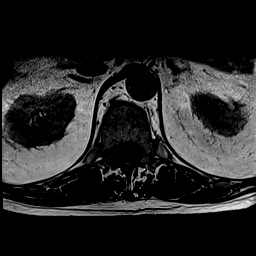

[Series 7: T2 · axial · 4.0mm · 0.78mm/px · z∈[-101,+102]mm · 16 of 39 slices shown (2 of 2)]
[im 1/39]
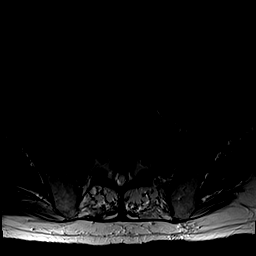
[im 3/39]
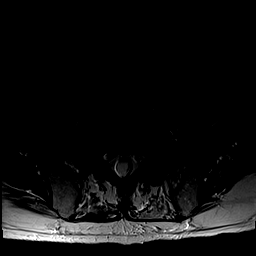
[im 6/39]
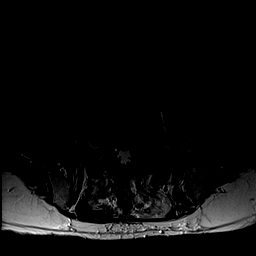
[im 8/39]
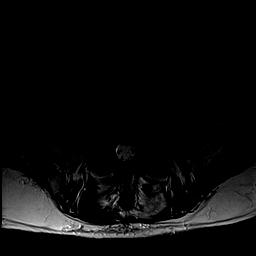
[im 11/39]
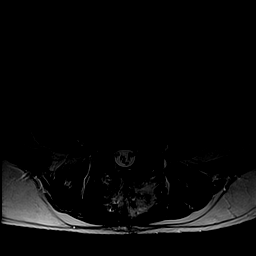
[im 13/39]
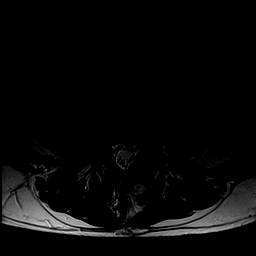
[im 16/39]
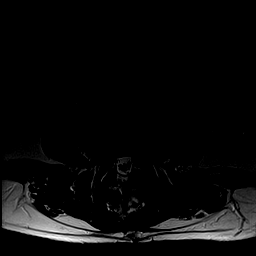
[im 18/39]
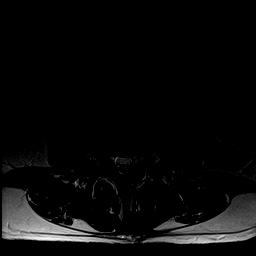
[im 21/39]
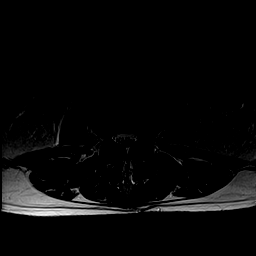
[im 23/39]
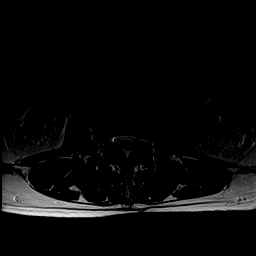
[im 26/39]
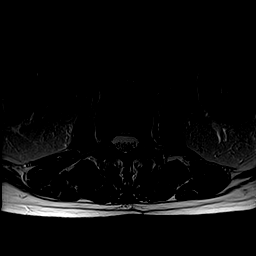
[im 28/39]
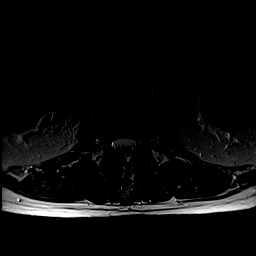
[im 31/39]
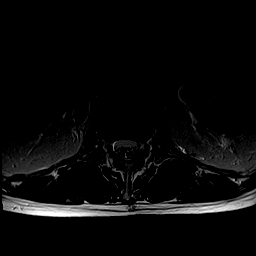
[im 33/39]
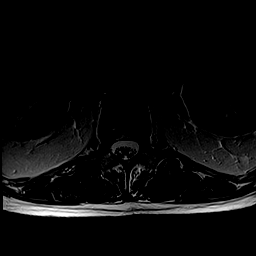
[im 36/39]
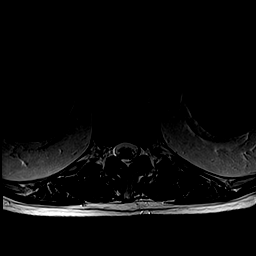
[im 39/39]
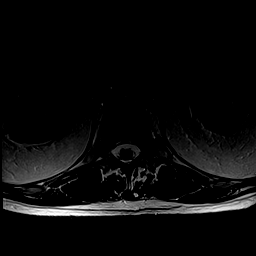

[42 of 48 positions shown; findings below may reference images not displayed]

FINDINGS: Segmentation: Conventional anatomy assumed, with the last open disc
space designated L5-S1.Concordant with previous imaging.

Alignment: Stable near anatomic alignment with minimal
retrolisthesis at L3-4.

Vertebrae: No worrisome osseous lesion, acute fracture or pars
defect. Interval posterior decompression at L3-4 and L4-5. Endplate
degenerative changes at those levels are similar to the previous
study. The visualized sacroiliac joints appear unremarkable.

Conus medullaris: Extends to the L1 level and appears normal.

Paraspinal and other soft tissues: No significant paraspinal
findings. Grossly stable left renal cysts.

Disc levels:

No significant disc space findings from T11-12 through L1-2. There
are anterior osteophytes at those levels, but no resulting spinal
stenosis or nerve root encroachment.

L2-3: Stable disc bulging, facet and ligamentous hypertrophy
contributing to borderline spinal stenosis and mild narrowing of the
lateral recesses. No nerve root encroachment.

L3-4: Interval posterior decompression with improved patency of the
spinal canal. There is loss of disc height with annular disc bulging
and bilateral facet hypertrophy contributing to mild residual
lateral recess and foraminal narrowing bilaterally.

L4-5: Interval posterior decompression with improved patency of the
spinal canal. Chronic loss of disc height with annular disc bulging,
endplate osteophytes and mild facet hypertrophy contribute to mild
residual narrowing of the lateral recesses and mild-to-moderate
foraminal narrowing bilaterally.

L5-S1: Disc height is relatively preserved. Stable mild disc bulging
and advanced left greater than right facet hypertrophy are again
noted contributing to stable asymmetric narrowing of the left
lateral recess and left foramen.
IMPRESSION: 1. Interval posterior decompression at L3-4 and L4-5 with improved
patency of the spinal canal. Mild residual lateral recess and
foraminal narrowing bilaterally at L3-4 and mild-to-moderate
foraminal narrowing bilaterally at L4-5.
2. Stable asymmetric narrowing of the left lateral recess and left
foramen at L5-S1.
3. No acute findings.

## 2020-02-21 ENCOUNTER — Other Ambulatory Visit: Payer: Self-pay | Admitting: Orthopedic Surgery

## 2020-02-21 DIAGNOSIS — M5416 Radiculopathy, lumbar region: Secondary | ICD-10-CM

## 2020-02-25 ENCOUNTER — Other Ambulatory Visit: Payer: Medicare Other

## 2020-02-26 ENCOUNTER — Other Ambulatory Visit: Payer: Self-pay

## 2020-02-26 ENCOUNTER — Other Ambulatory Visit: Payer: Self-pay | Admitting: Orthopedic Surgery

## 2020-02-26 ENCOUNTER — Ambulatory Visit
Admission: RE | Admit: 2020-02-26 | Discharge: 2020-02-26 | Disposition: A | Discharge status: Home / Self Care | Payer: Medicare Other | Source: Ambulatory Visit | Attending: Orthopedic Surgery | Admitting: Orthopedic Surgery

## 2020-02-26 DIAGNOSIS — M5416 Radiculopathy, lumbar region: Secondary | ICD-10-CM

## 2020-02-26 IMAGING — XA DG FACET JT INJ L OR S SPINE SECOND LEVEL UNILATERAL
2 series · 2 of 2 positions shown · non-contrast
Comparison: none

CLINICAL DATA: Recent lumbar surgery.  Left low back pain.

EXAM:
LEFT L4-5 FACET INJECTION UNDER FLUOROSCOPY
LEFT L5-S1 FACET INJECTION UNDER FLUOROSCOPY
FLUOROSCOPY TIME:  57 seconds; 78 [QN] DAP
TECHNIQUE: The procedure, risks (including but not limited to bleeding,
infection, organ damage ), benefits, and alternatives were explained
to the patient. Questions regarding the procedure were encouraged
and answered. The patient understands and consents to the procedure.

[Series 2: ortho adipose · 1 of 1 slices shown (1 of 2)]
[im 1/1]
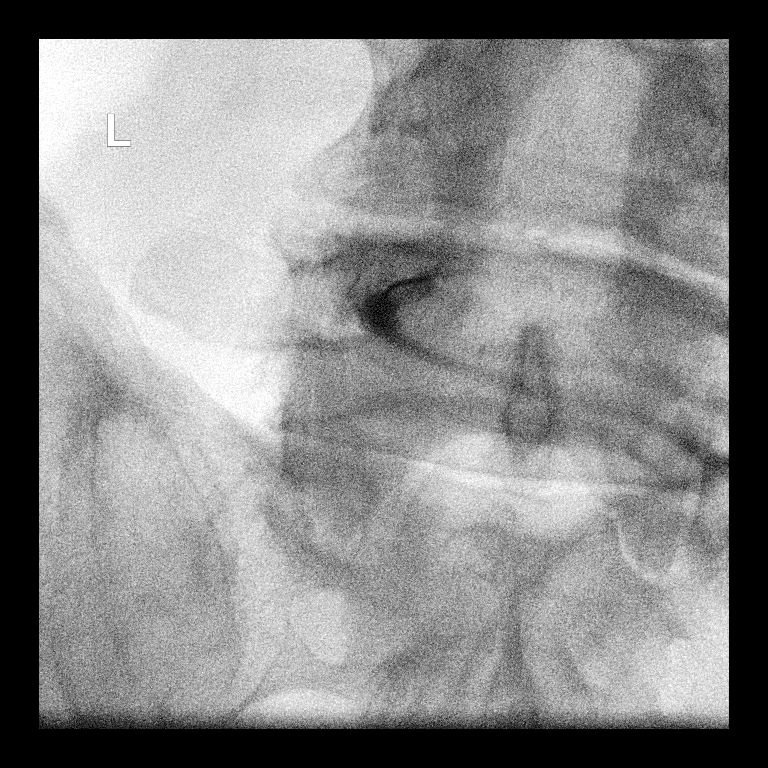

[Series 3: ortho adipose · 1 of 1 slices shown (2 of 2)]
[im 1/1]
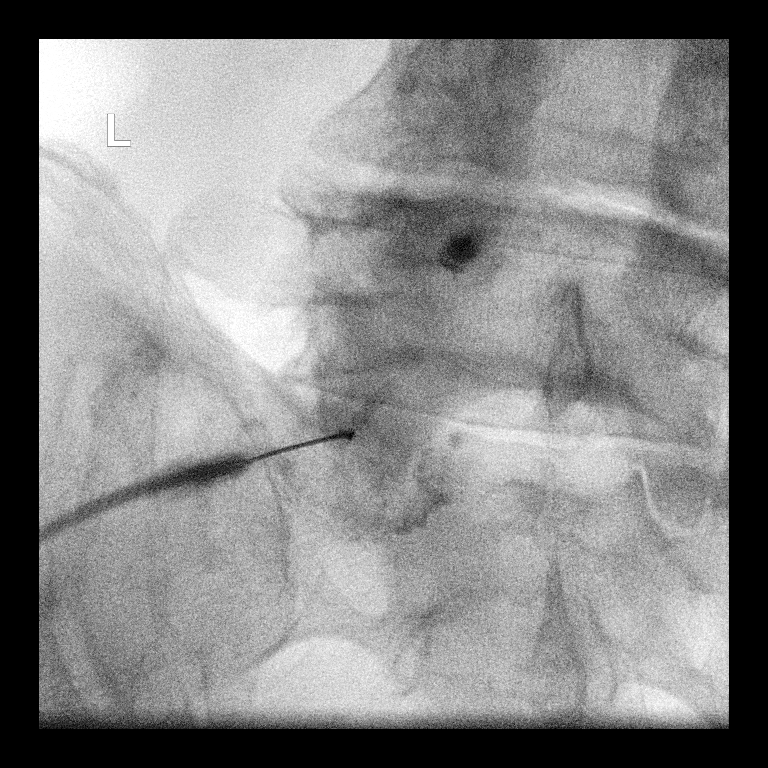

[2 of 2 positions shown; findings below may reference images not displayed]

Appropriate skin entry sites determined fluoroscopically and marked.
Operator donned sterile gloves and mask. Sites prepped with
betadine, draped in usual sterile fashion, and infiltrated locally
with 1% lidocaine. A 22 gauge spinal needle was advanced to the
posterior margin of the left L4-5 facet under fluoroscopy.
Diagnostic injection of 0.5 ml Isovue-M 200 confirmed
intra-articular/juxta-articular spread without any intrathecal or
intravascular component.

A 22 gauge spinal needle was advanced to the posterior margin of the
left L5-S1 facet under fluoroscopy. Diagnostic injection of 0.5 ml
Isovue-M 200 confirmed intra-articular/juxta-articular spread
without any intrathecal or intravascular component.

120mg Depo-Medrol in 2ml lidocaine 1% was divided evenly between the
2 sites. The patient tolerated the procedure well.

COMPLICATIONS:
COMPLICATIONS
None
IMPRESSION: 1. Technically successful left L4-5 facet injection.
2. Technically successful left L5-S1 facet injection.

## 2020-02-26 IMAGING — XA DG FACET JT INJ L OR S SPINE SINGLE LEVEL UNI
2 series · 2 of 2 positions shown · non-contrast
Comparison: none

CLINICAL DATA: Recent lumbar surgery.  Left low back pain.

EXAM:
LEFT L4-5 FACET INJECTION UNDER FLUOROSCOPY
LEFT L5-S1 FACET INJECTION UNDER FLUOROSCOPY
FLUOROSCOPY TIME:  57 seconds; 78 [QN] DAP
TECHNIQUE: The procedure, risks (including but not limited to bleeding,
infection, organ damage ), benefits, and alternatives were explained
to the patient. Questions regarding the procedure were encouraged
and answered. The patient understands and consents to the procedure.

[Series 2: ortho adipose · 1 of 1 slices shown (1 of 2)]
[im 1/1]
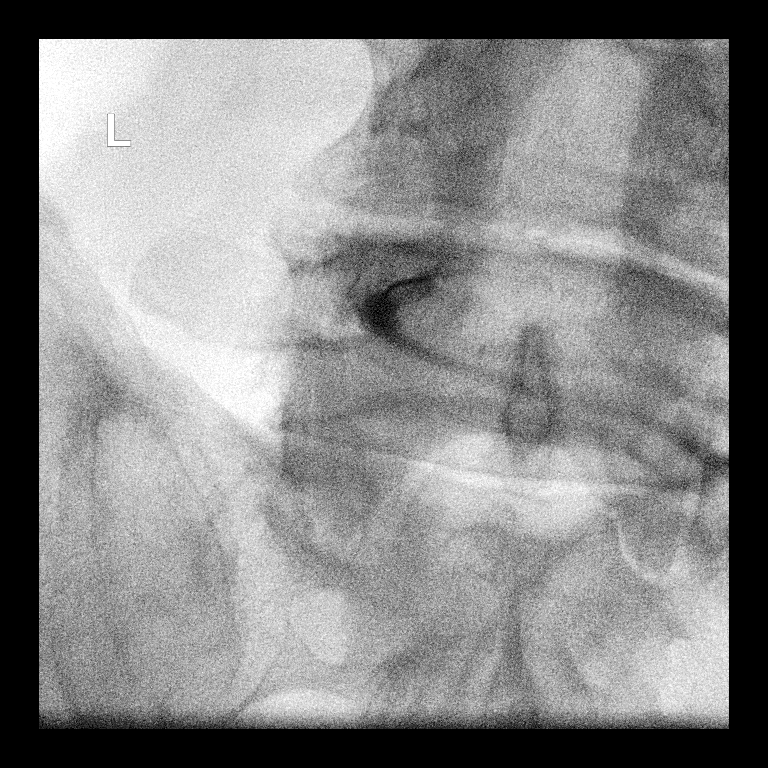

[Series 3: ortho adipose · 1 of 1 slices shown (2 of 2)]
[im 1/1]
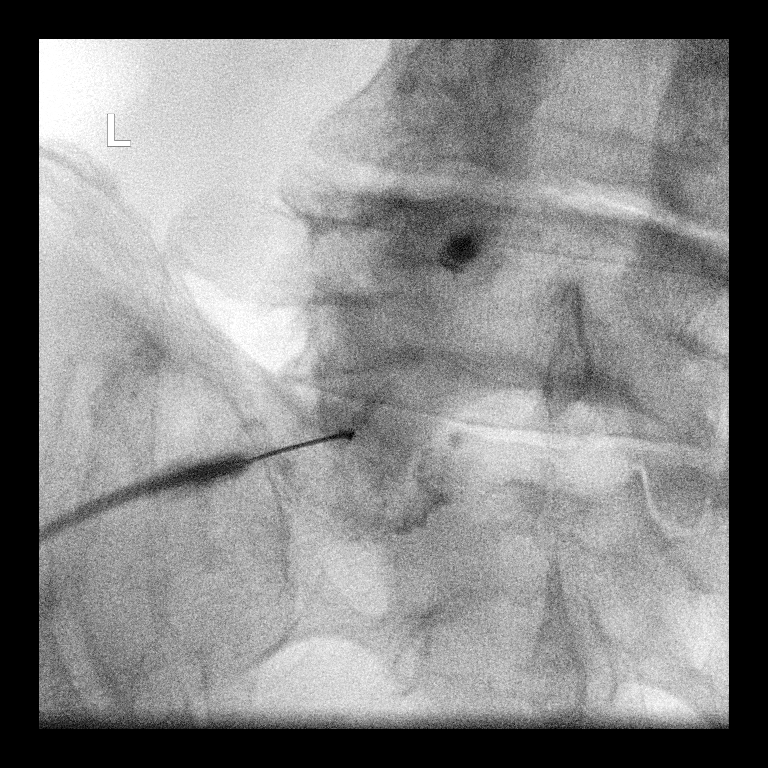

[2 of 2 positions shown; findings below may reference images not displayed]

Appropriate skin entry sites determined fluoroscopically and marked.
Operator donned sterile gloves and mask. Sites prepped with
betadine, draped in usual sterile fashion, and infiltrated locally
with 1% lidocaine. A 22 gauge spinal needle was advanced to the
posterior margin of the left L4-5 facet under fluoroscopy.
Diagnostic injection of 0.5 ml Isovue-M 200 confirmed
intra-articular/juxta-articular spread without any intrathecal or
intravascular component.

A 22 gauge spinal needle was advanced to the posterior margin of the
left L5-S1 facet under fluoroscopy. Diagnostic injection of 0.5 ml
Isovue-M 200 confirmed intra-articular/juxta-articular spread
without any intrathecal or intravascular component.

120mg Depo-Medrol in 2ml lidocaine 1% was divided evenly between the
2 sites. The patient tolerated the procedure well.

COMPLICATIONS:
COMPLICATIONS
None
IMPRESSION: 1. Technically successful left L4-5 facet injection.
2. Technically successful left L5-S1 facet injection.

## 2020-02-26 MED ORDER — METHYLPREDNISOLONE ACETATE 40 MG/ML INJ SUSP (RADIOLOG
120.0000 mg | Freq: Once | INTRAMUSCULAR | Status: AC
  Administered 2020-02-26: 120 mg via INTRA_ARTICULAR

## 2020-02-26 MED ORDER — IOPAMIDOL (ISOVUE-M 200) INJECTION 41%
1.0000 mL | Freq: Once | INTRAMUSCULAR | Status: AC
  Administered 2020-02-26: 1 mL via INTRA_ARTICULAR

## 2020-02-26 NOTE — Discharge Instructions (Signed)

## 2020-09-25 ENCOUNTER — Other Ambulatory Visit: Payer: Self-pay | Admitting: Orthopedic Surgery

## 2020-09-25 DIAGNOSIS — M545 Low back pain, unspecified: Secondary | ICD-10-CM

## 2020-09-25 DIAGNOSIS — G8929 Other chronic pain: Secondary | ICD-10-CM

## 2020-10-01 ENCOUNTER — Ambulatory Visit
Admission: RE | Admit: 2020-10-01 | Discharge: 2020-10-01 | Disposition: A | Discharge status: Home / Self Care | Payer: Medicare Other | Source: Ambulatory Visit | Attending: Orthopedic Surgery | Admitting: Orthopedic Surgery

## 2020-10-01 DIAGNOSIS — G8929 Other chronic pain: Secondary | ICD-10-CM

## 2020-10-01 DIAGNOSIS — M545 Low back pain, unspecified: Secondary | ICD-10-CM

## 2020-10-01 IMAGING — XA DG FACET JT INJ L OR S SPINE SINGLE LEVEL UNI
4 series · 4 of 4 positions shown · non-contrast
Comparison: none

CLINICAL DATA: Lumbosacral spondylosis without myelopathy. Lumbar
facet arthropathy. Left greater than right low back and leg pain.
Positive response to left-sided L4-5 and L5-S1 facet injections in
[DATE]. Bilateral injections requested for today.

[Series 1: ortho standard · 1 of 1 slices shown (1 of 4)]
[im 1/1]
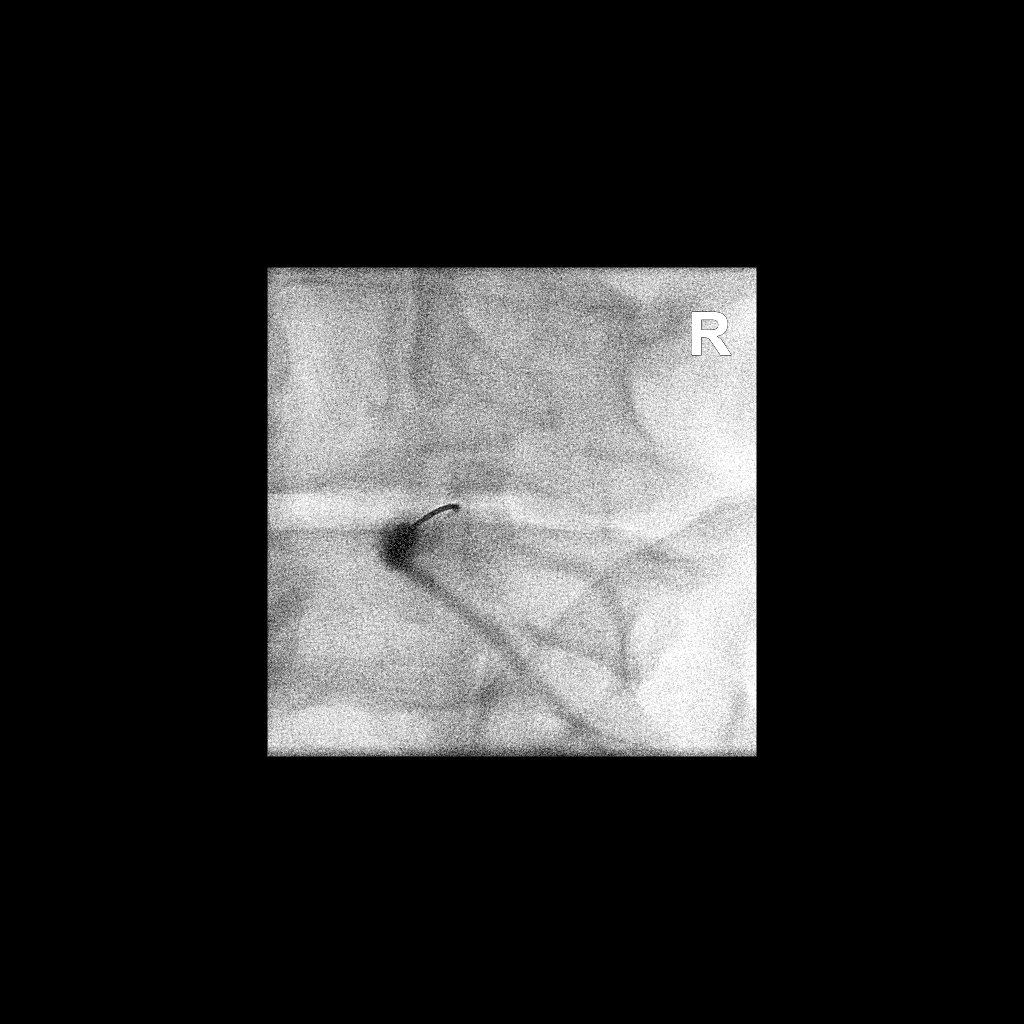

[Series 2: ortho standard · 1 of 1 slices shown (2 of 4)]
[im 1/1]
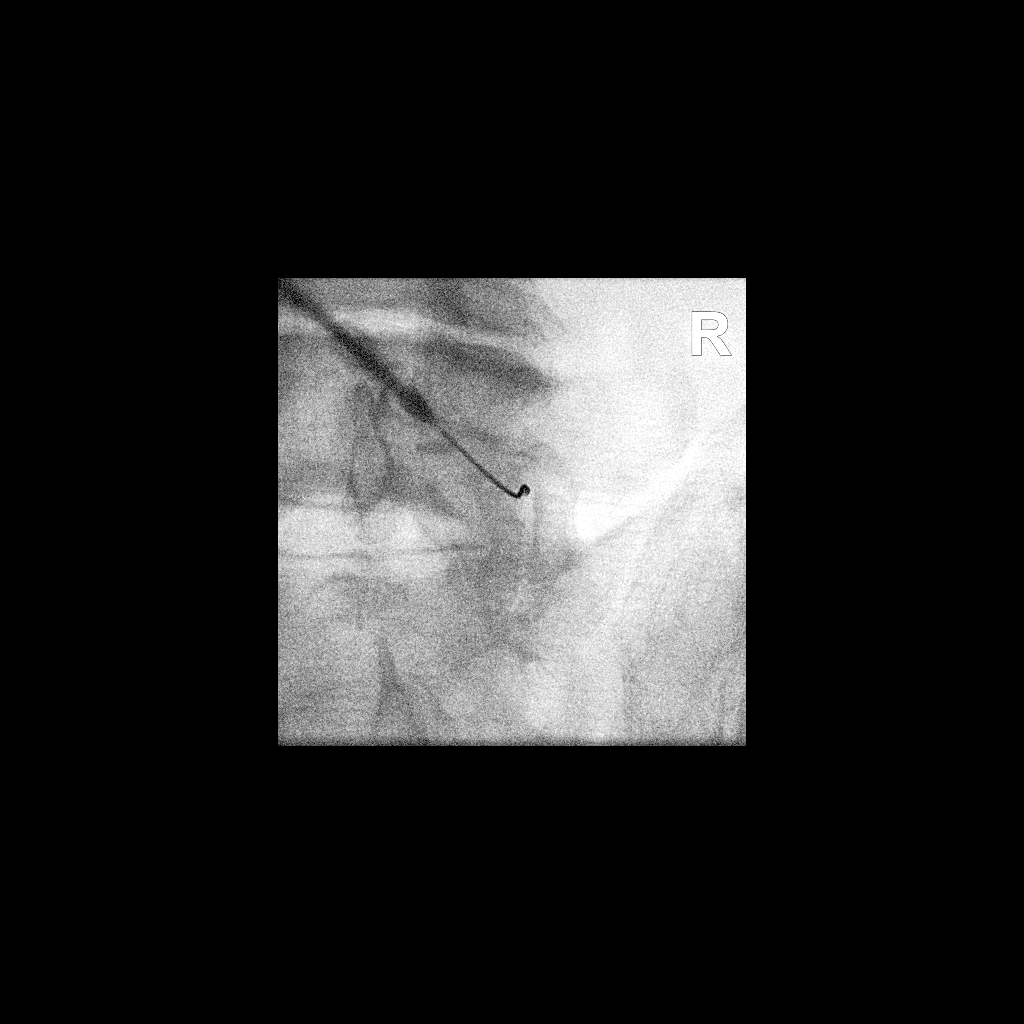

[Series 3: ortho standard · 1 of 1 slices shown (3 of 4)]
[im 1/1]
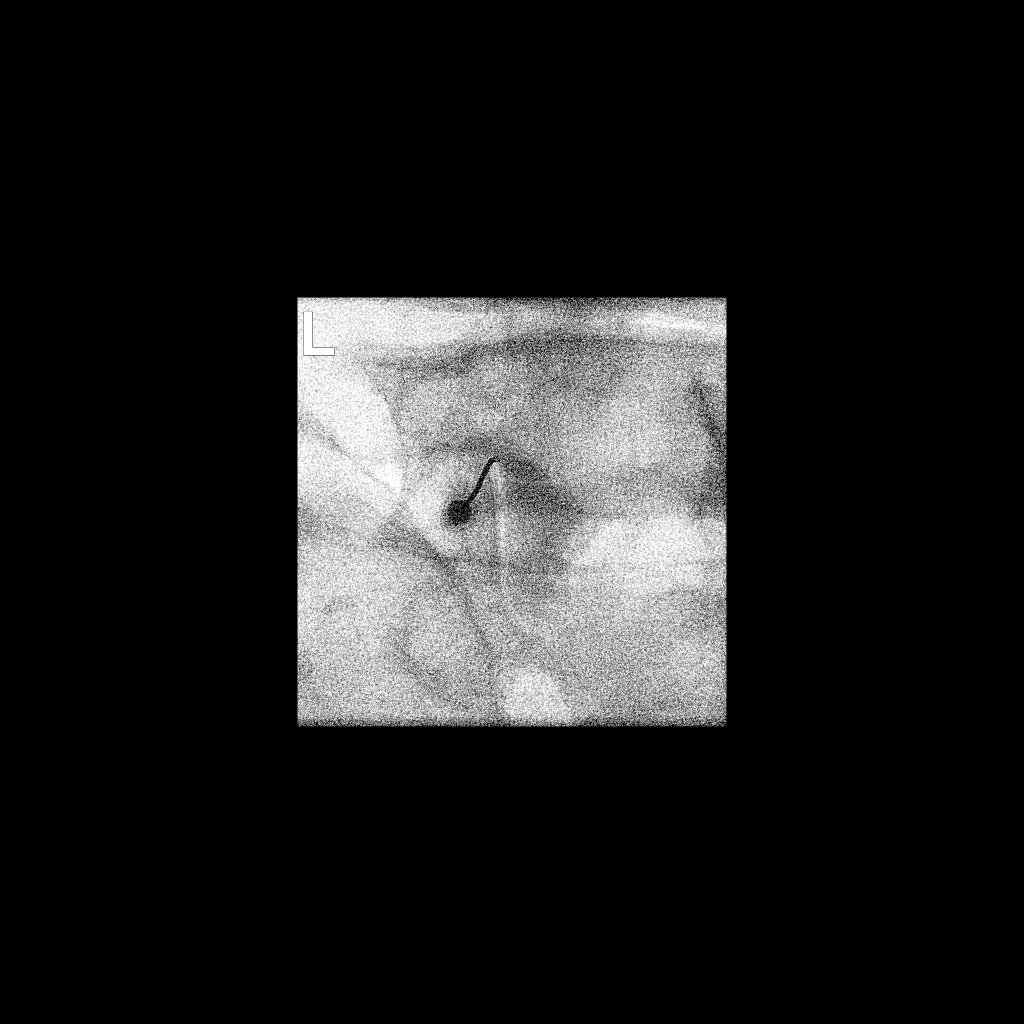

[Series 4: ortho standard · 1 of 1 slices shown (4 of 4)]
[im 1/1]
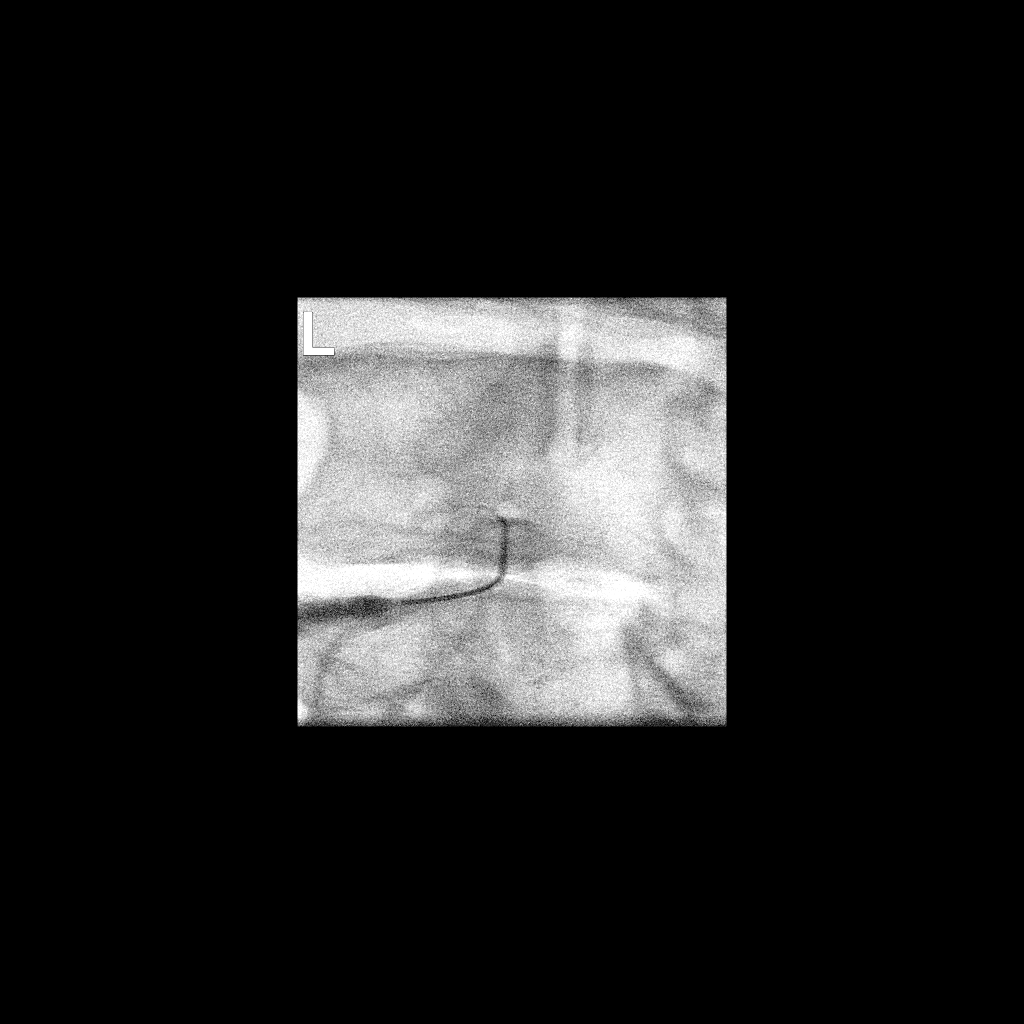

[4 of 4 positions shown; findings below may reference images not displayed]

FLUOROSCOPY TIME:  Fluoroscopy Time: 1 minutes 33 seconds

Radiation Exposure Index: 41.03 microGray*m^2

PROCEDURE:
The procedure, risks, benefits, and alternatives were explained to
the patient. Questions regarding the procedure were encouraged and
answered. The patient understands and consents to the procedure.

BILATERAL L4-5 AND L5-S1 FACET INJECTIONS: A posterior oblique
approach was taken to the facets on the right and left at L4-5 and
L5-S1 using curved 3.5 inch 22 gauge spinal needles. Intra-articular
positioning was confirmed by injecting a small amount of Isovue-M
200. No vascular opacification is seen. 30 mg of Depo-Medrol mixed
with 1 mL of 0.5% bupivacaine were instilled into each of the 4
joints. The procedure was well-tolerated.
IMPRESSION: Technically successful bilateral L4-5 and L5-S1 facet injections.

## 2020-10-01 IMAGING — XA DG FACET JT INJ L OR S SPINE SINGLE LEVEL UNI
4 series · 4 of 4 positions shown · non-contrast
Comparison: none

CLINICAL DATA: Lumbosacral spondylosis without myelopathy. Lumbar
facet arthropathy. Left greater than right low back and leg pain.
Positive response to left-sided L4-5 and L5-S1 facet injections in
[DATE]. Bilateral injections requested for today.

[Series 1: ortho standard · 1 of 1 slices shown (1 of 4)]
[im 1/1]
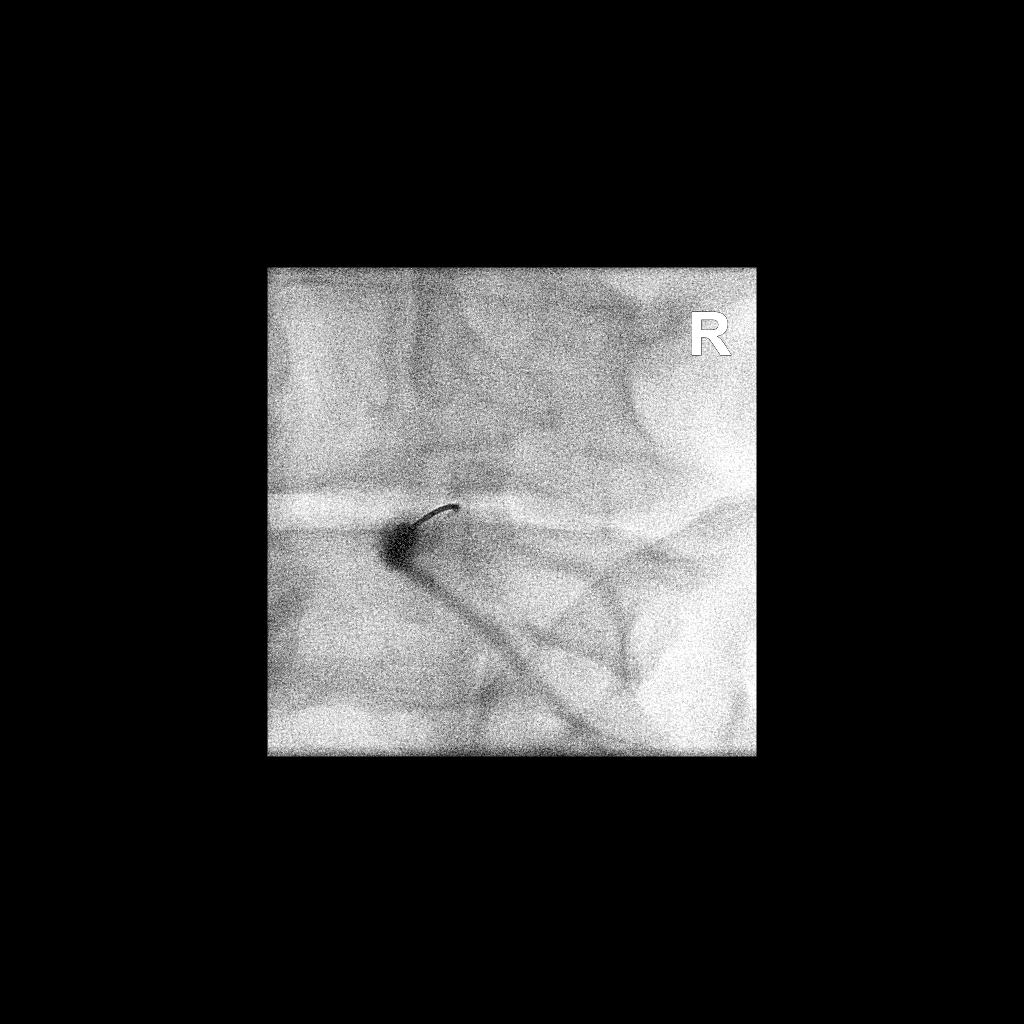

[Series 2: ortho standard · 1 of 1 slices shown (2 of 4)]
[im 1/1]
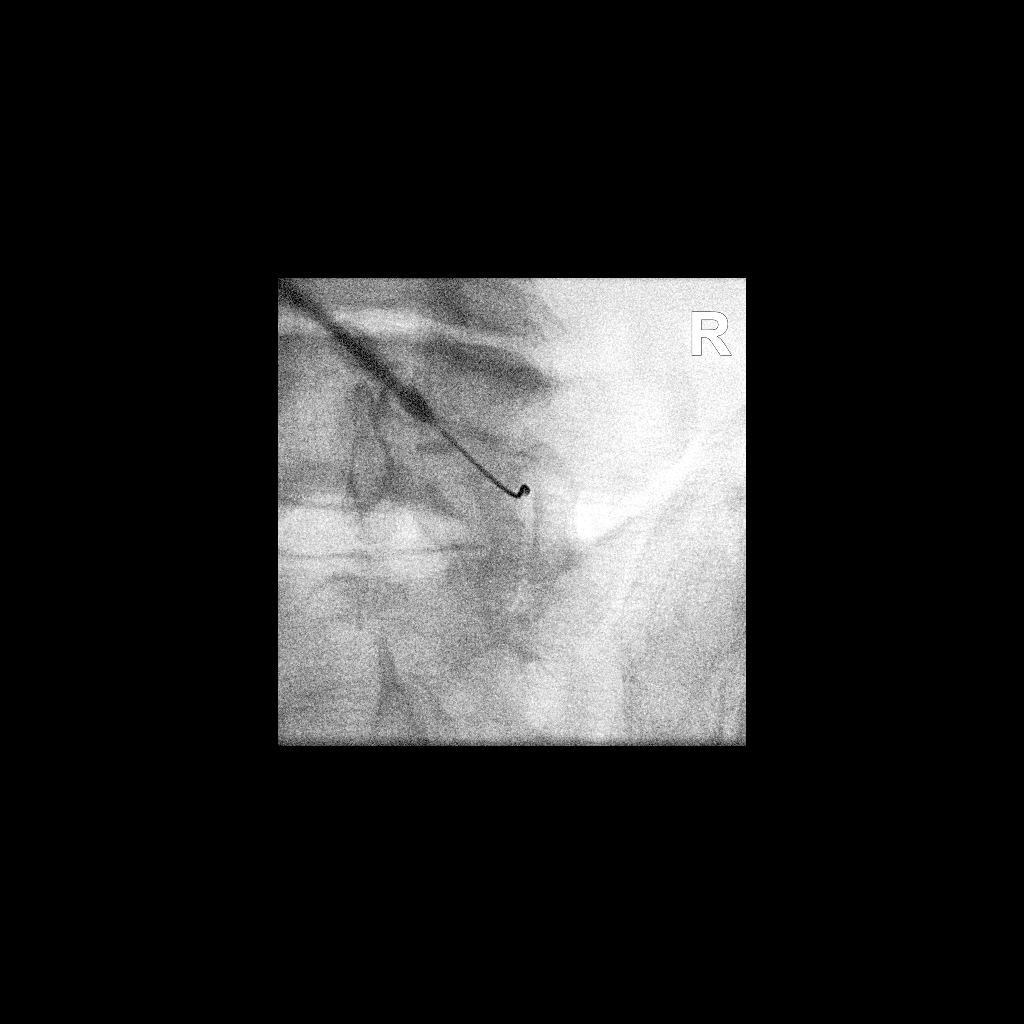

[Series 3: ortho standard · 1 of 1 slices shown (3 of 4)]
[im 1/1]
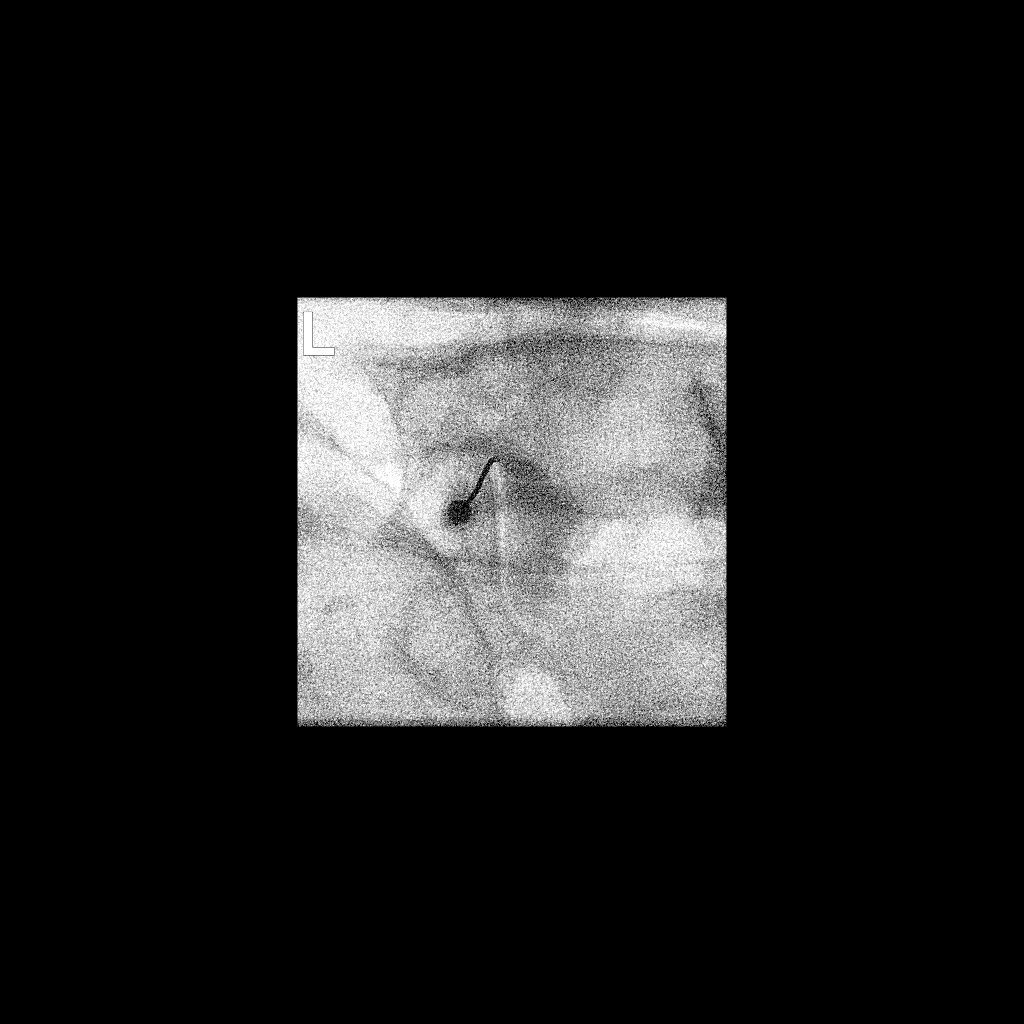

[Series 4: ortho standard · 1 of 1 slices shown (4 of 4)]
[im 1/1]
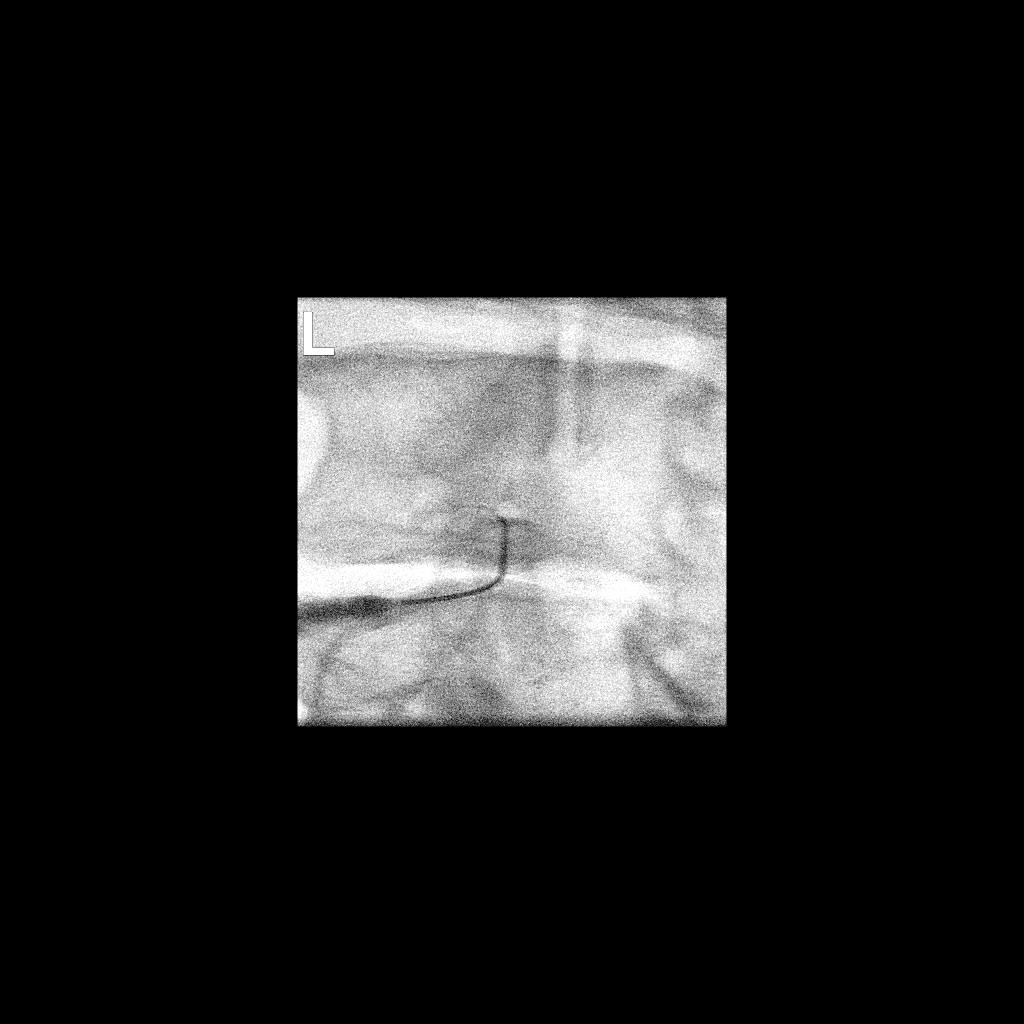

[4 of 4 positions shown; findings below may reference images not displayed]

FLUOROSCOPY TIME:  Fluoroscopy Time: 1 minutes 33 seconds

Radiation Exposure Index: 41.03 microGray*m^2

PROCEDURE:
The procedure, risks, benefits, and alternatives were explained to
the patient. Questions regarding the procedure were encouraged and
answered. The patient understands and consents to the procedure.

BILATERAL L4-5 AND L5-S1 FACET INJECTIONS: A posterior oblique
approach was taken to the facets on the right and left at L4-5 and
L5-S1 using curved 3.5 inch 22 gauge spinal needles. Intra-articular
positioning was confirmed by injecting a small amount of Isovue-M
200. No vascular opacification is seen. 30 mg of Depo-Medrol mixed
with 1 mL of 0.5% bupivacaine were instilled into each of the 4
joints. The procedure was well-tolerated.
IMPRESSION: Technically successful bilateral L4-5 and L5-S1 facet injections.

## 2020-10-01 IMAGING — XA DG FACET JT INJ L OR S SPINE SINGLE LEVEL UNI
4 series · 4 of 4 positions shown · non-contrast
Comparison: none

CLINICAL DATA: Lumbosacral spondylosis without myelopathy. Lumbar
facet arthropathy. Left greater than right low back and leg pain.
Positive response to left-sided L4-5 and L5-S1 facet injections in
[DATE]. Bilateral injections requested for today.

[Series 1: ortho standard · 1 of 1 slices shown (1 of 4)]
[im 1/1]
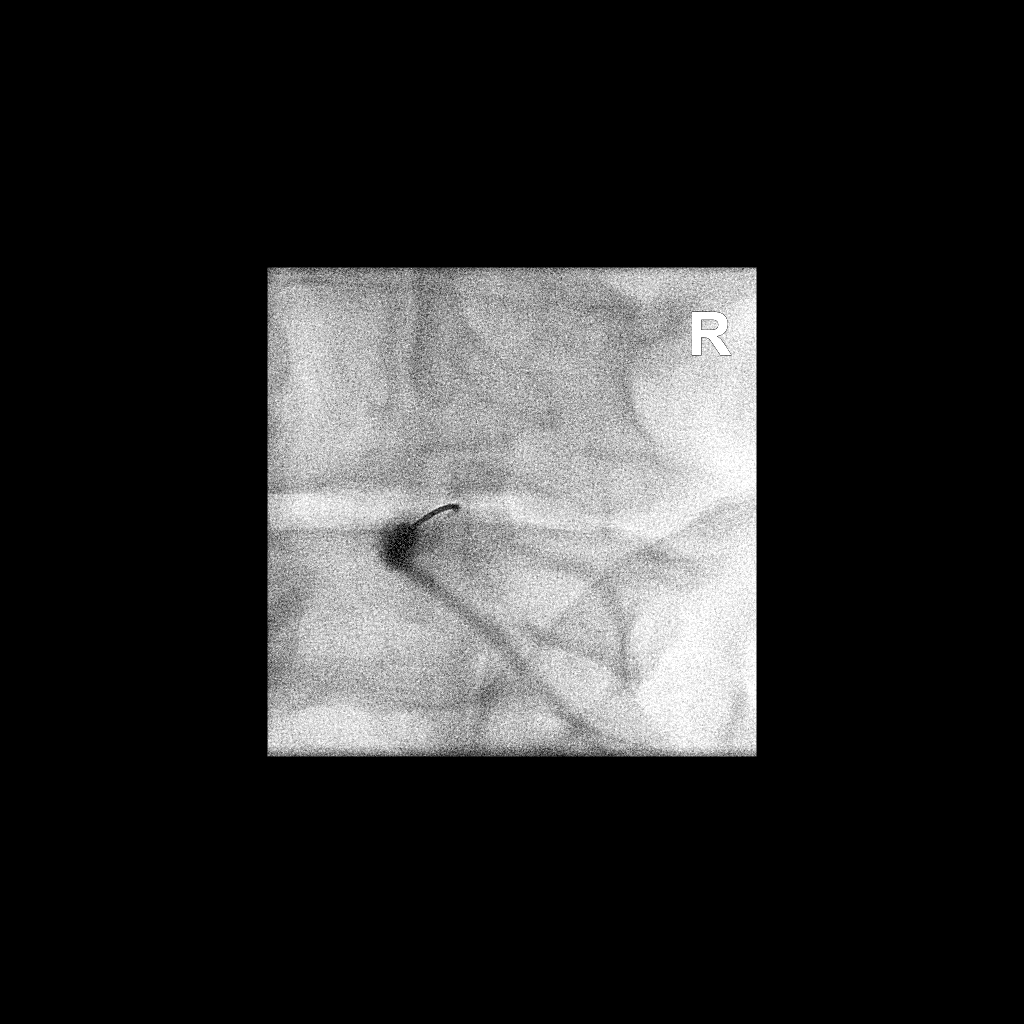

[Series 2: ortho standard · 1 of 1 slices shown (2 of 4)]
[im 1/1]
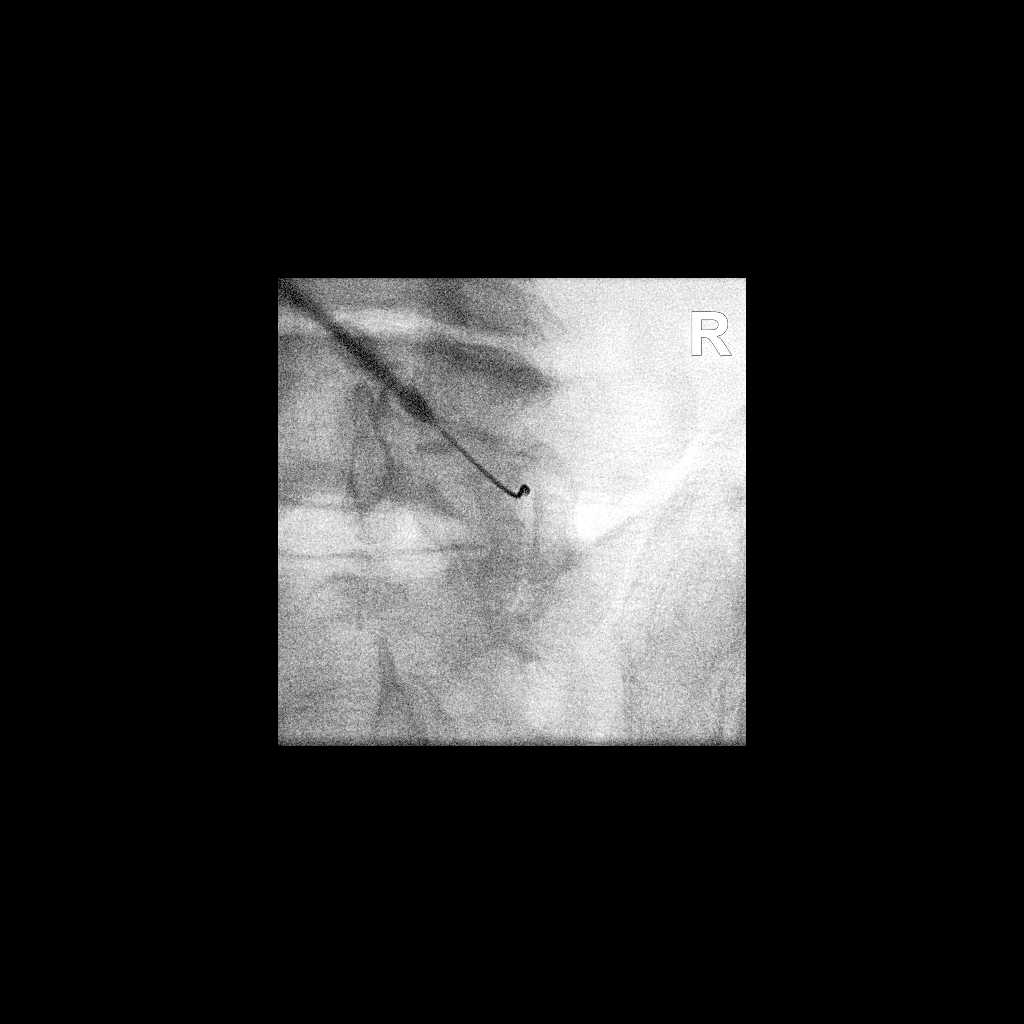

[Series 3: ortho standard · 1 of 1 slices shown (3 of 4)]
[im 1/1]
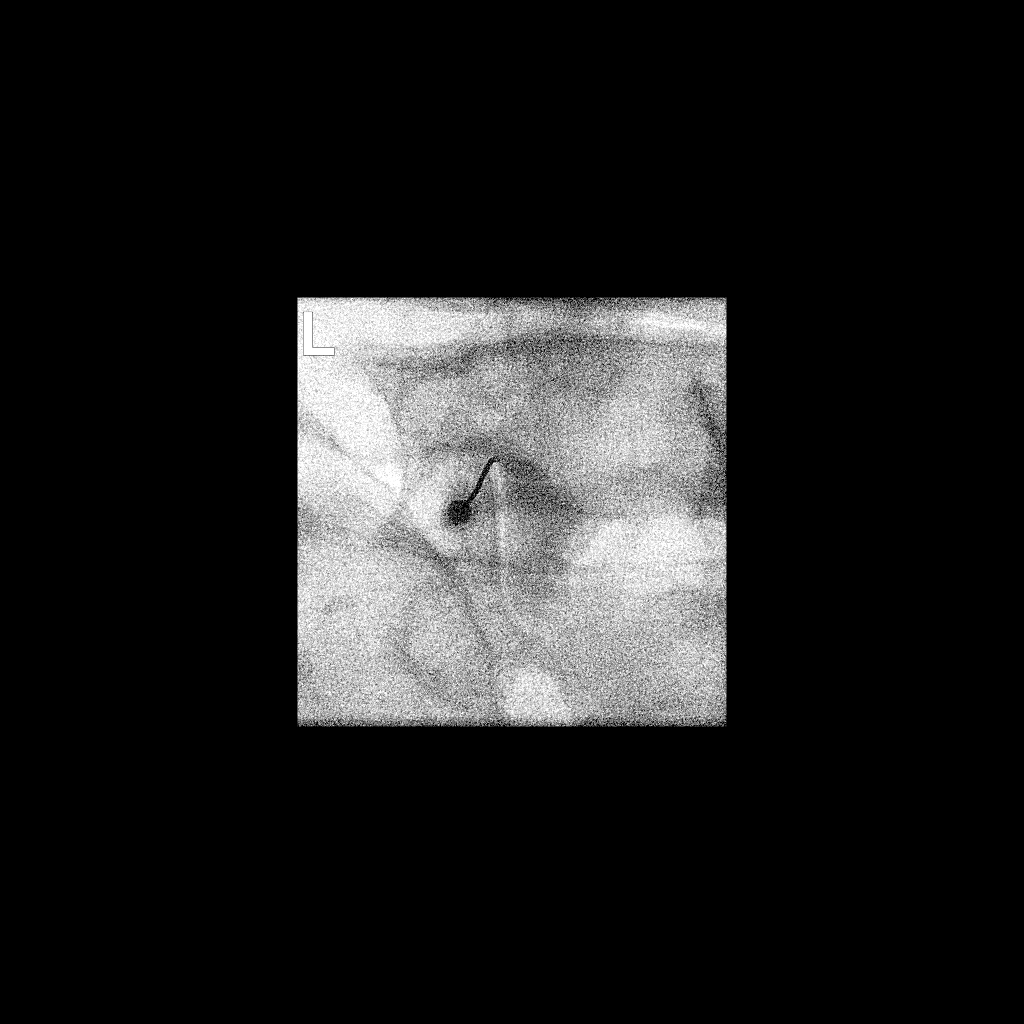

[Series 4: ortho standard · 1 of 1 slices shown (4 of 4)]
[im 1/1]
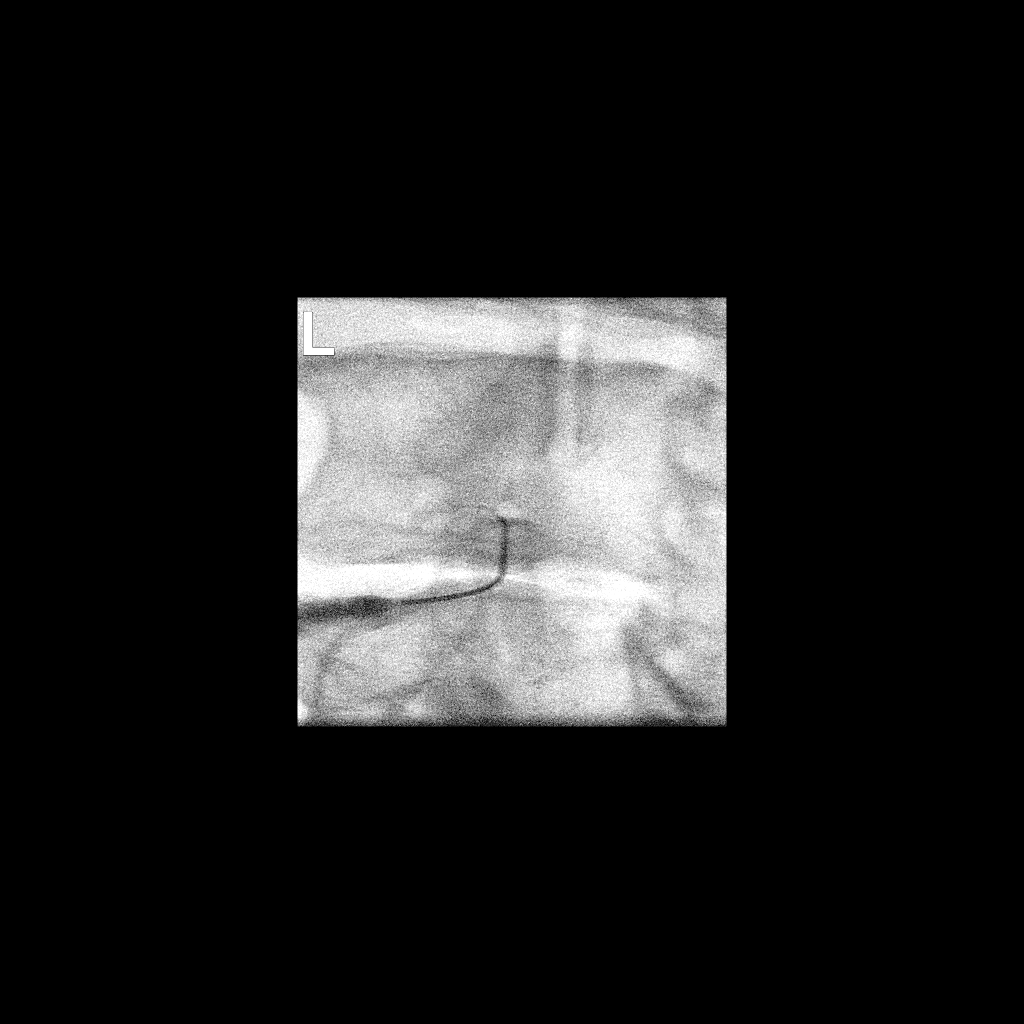

[4 of 4 positions shown; findings below may reference images not displayed]

FLUOROSCOPY TIME:  Fluoroscopy Time: 1 minutes 33 seconds

Radiation Exposure Index: 41.03 microGray*m^2

PROCEDURE:
The procedure, risks, benefits, and alternatives were explained to
the patient. Questions regarding the procedure were encouraged and
answered. The patient understands and consents to the procedure.

BILATERAL L4-5 AND L5-S1 FACET INJECTIONS: A posterior oblique
approach was taken to the facets on the right and left at L4-5 and
L5-S1 using curved 3.5 inch 22 gauge spinal needles. Intra-articular
positioning was confirmed by injecting a small amount of Isovue-M
200. No vascular opacification is seen. 30 mg of Depo-Medrol mixed
with 1 mL of 0.5% bupivacaine were instilled into each of the 4
joints. The procedure was well-tolerated.
IMPRESSION: Technically successful bilateral L4-5 and L5-S1 facet injections.

## 2020-10-01 IMAGING — XA DG FACET JT INJ L OR S SPINE SINGLE LEVEL UNI
4 series · 4 of 4 positions shown · non-contrast
Comparison: none

CLINICAL DATA: Lumbosacral spondylosis without myelopathy. Lumbar
facet arthropathy. Left greater than right low back and leg pain.
Positive response to left-sided L4-5 and L5-S1 facet injections in
[DATE]. Bilateral injections requested for today.

[Series 1: ortho standard · 1 of 1 slices shown (1 of 4)]
[im 1/1]
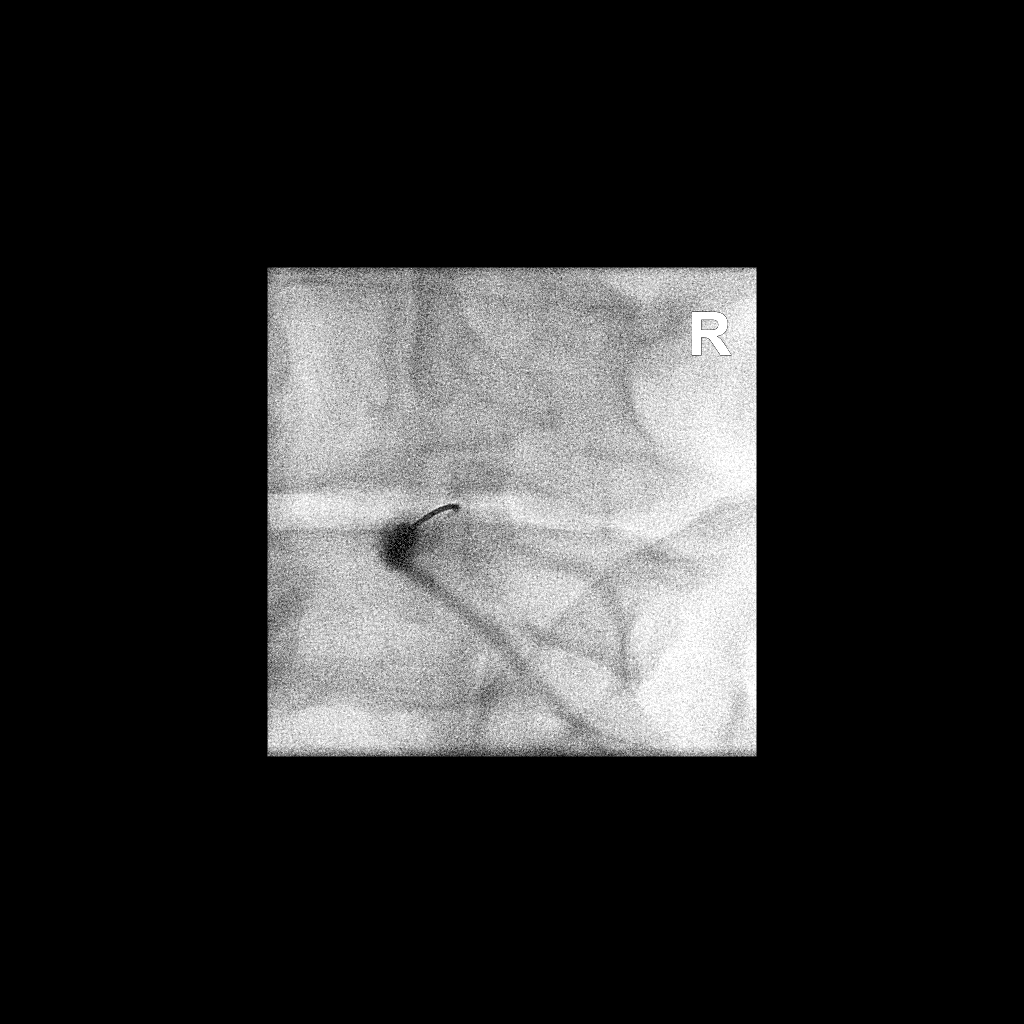

[Series 2: ortho standard · 1 of 1 slices shown (2 of 4)]
[im 1/1]
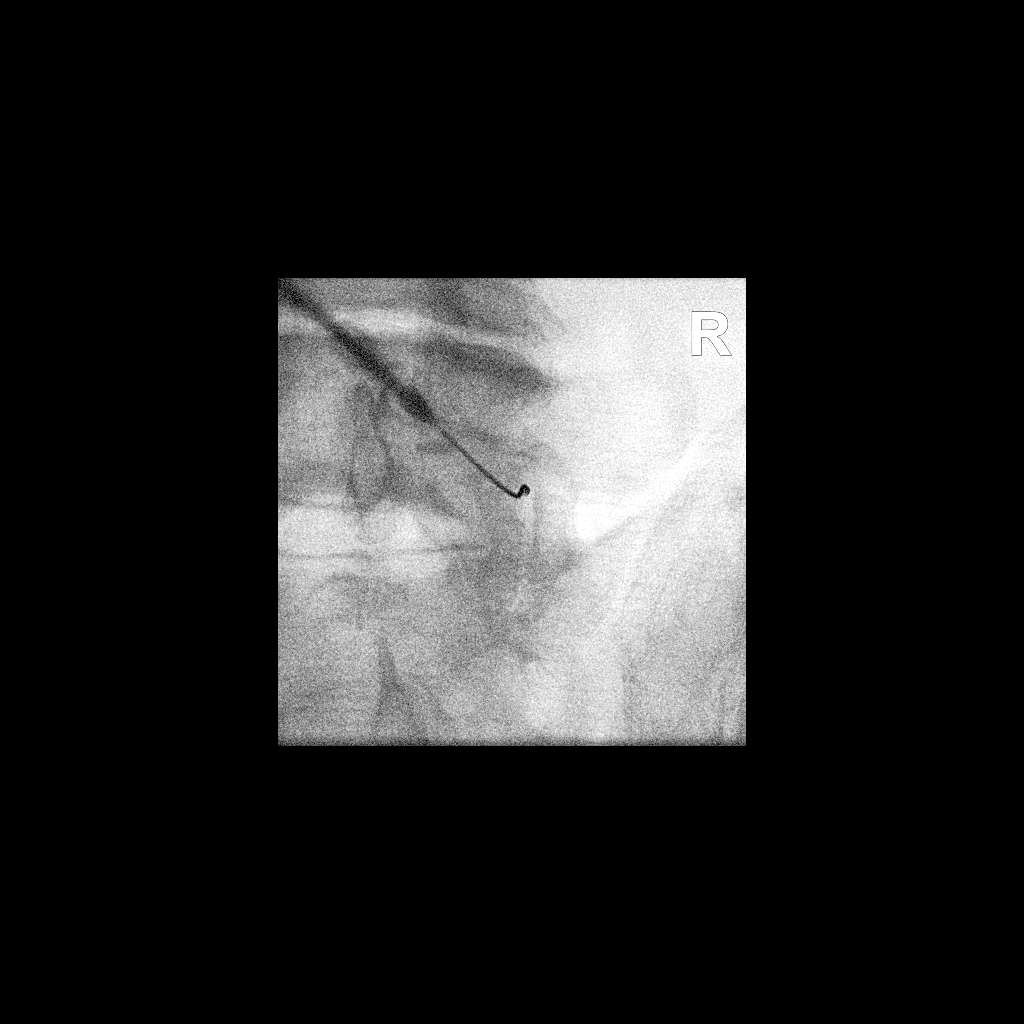

[Series 3: ortho standard · 1 of 1 slices shown (3 of 4)]
[im 1/1]
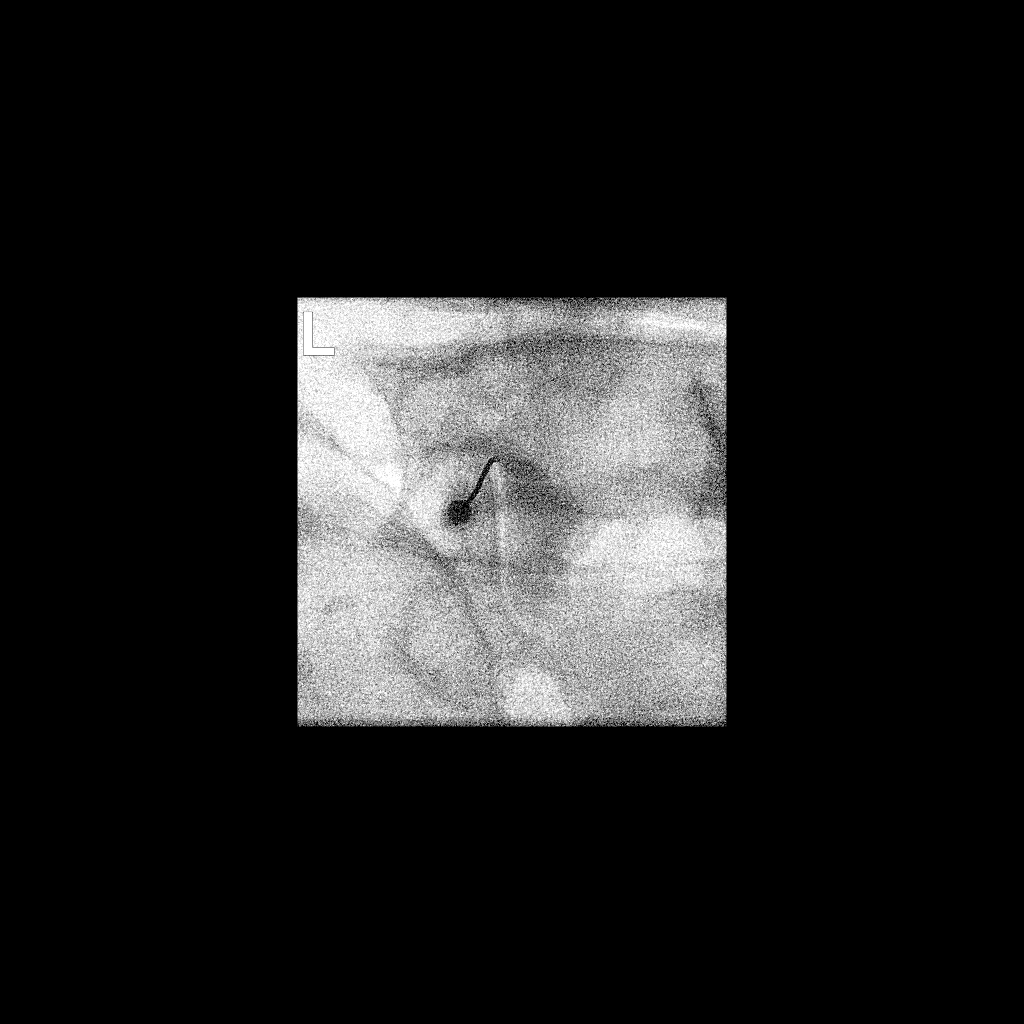

[Series 4: ortho standard · 1 of 1 slices shown (4 of 4)]
[im 1/1]
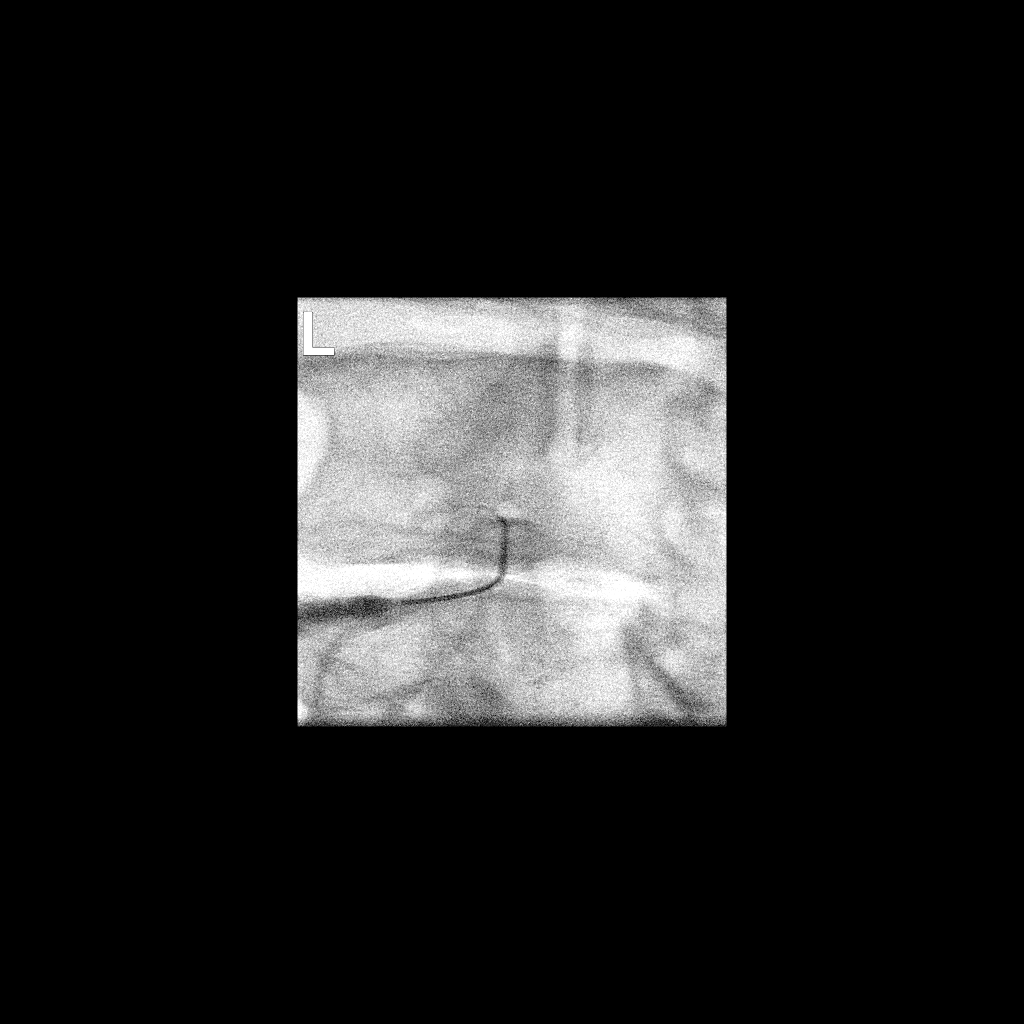

[4 of 4 positions shown; findings below may reference images not displayed]

FLUOROSCOPY TIME:  Fluoroscopy Time: 1 minutes 33 seconds

Radiation Exposure Index: 41.03 microGray*m^2

PROCEDURE:
The procedure, risks, benefits, and alternatives were explained to
the patient. Questions regarding the procedure were encouraged and
answered. The patient understands and consents to the procedure.

BILATERAL L4-5 AND L5-S1 FACET INJECTIONS: A posterior oblique
approach was taken to the facets on the right and left at L4-5 and
L5-S1 using curved 3.5 inch 22 gauge spinal needles. Intra-articular
positioning was confirmed by injecting a small amount of Isovue-M
200. No vascular opacification is seen. 30 mg of Depo-Medrol mixed
with 1 mL of 0.5% bupivacaine were instilled into each of the 4
joints. The procedure was well-tolerated.
IMPRESSION: Technically successful bilateral L4-5 and L5-S1 facet injections.

## 2020-10-01 MED ORDER — IOPAMIDOL (ISOVUE-M 200) INJECTION 41%: 1.0000 mL | Freq: Once | INTRAMUSCULAR | Status: DC

## 2020-10-01 MED ORDER — METHYLPREDNISOLONE ACETATE 40 MG/ML INJ SUSP (RADIOLOG: 120.0000 mg | Freq: Once | INTRAMUSCULAR | Status: DC

## 2020-10-01 NOTE — Discharge Instr - Other Orders (Signed)
Pt in nursing recovery area. Pts BP elevated. Per pt, "I have white coat syndrome" "it is always high after dr appointments". Pt denies any pain at this time.

## 2020-10-01 NOTE — Discharge Instructions (Signed)

## 2020-10-11 ENCOUNTER — Other Ambulatory Visit: Payer: Self-pay

## 2020-10-11 ENCOUNTER — Ambulatory Visit
Admission: RE | Admit: 2020-10-11 | Discharge: 2020-10-11 | Disposition: A | Discharge status: Home / Self Care | Payer: Medicare Other | Source: Ambulatory Visit | Attending: Orthopedic Surgery | Admitting: Orthopedic Surgery

## 2020-10-11 DIAGNOSIS — M545 Low back pain, unspecified: Secondary | ICD-10-CM

## 2020-10-11 IMAGING — MR MR LUMBAR SPINE W/O CM
4 of 5 series · 27 of 48 positions shown · non-contrast
Comparison: [DATE]

CLINICAL DATA: Pain radiating from bilateral buttocks to the calfs.

EXAM:
MRI LUMBAR SPINE WITHOUT CONTRAST
TECHNIQUE: Multiplanar, multisequence MR imaging of the lumbar spine was
performed. No intravenous contrast was administered.

[Series 3: T2 · sagittal · 4.0mm · 1.09mm/px · 6 of 17 slices shown (1 of 2)]
[im 1/17]
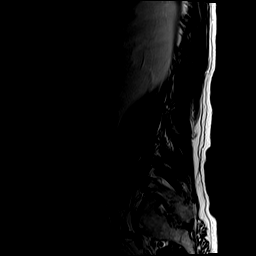
[im 4/17]
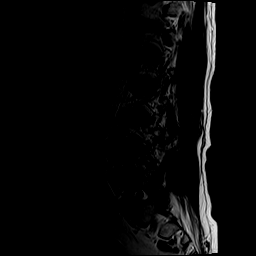
[im 7/17]
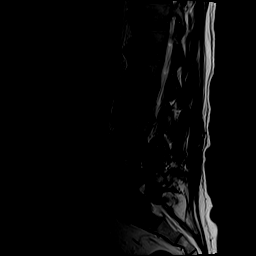
[im 10/17]
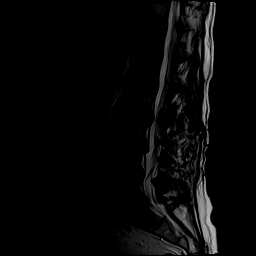
[im 13/17]
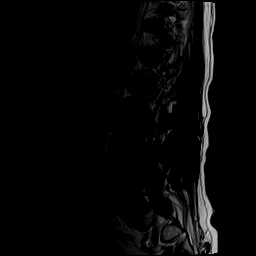
[im 17/17]
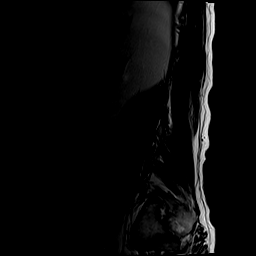

[Series 5: T1 · sagittal · 4.0mm · 1.09mm/px · 6 of 17 slices shown (1 of 2)]
[im 1/17]
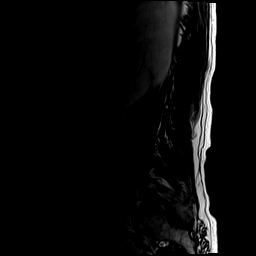
[im 4/17]
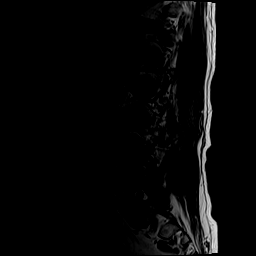
[im 7/17]
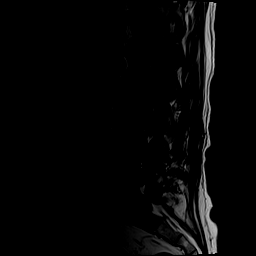
[im 10/17]
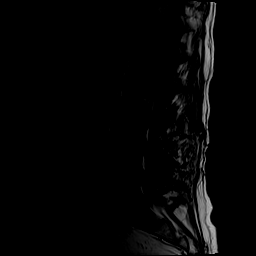
[im 13/17]
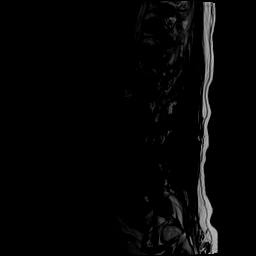
[im 17/17]
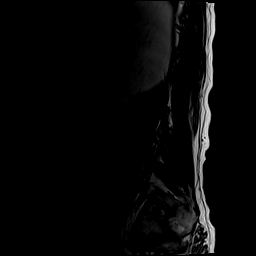

[Series 6: T2 · axial · 4.0mm · 0.39mm/px · z∈[-55,+163]mm · 9 of 42 slices shown (2 of 2)]
[im 1/42]
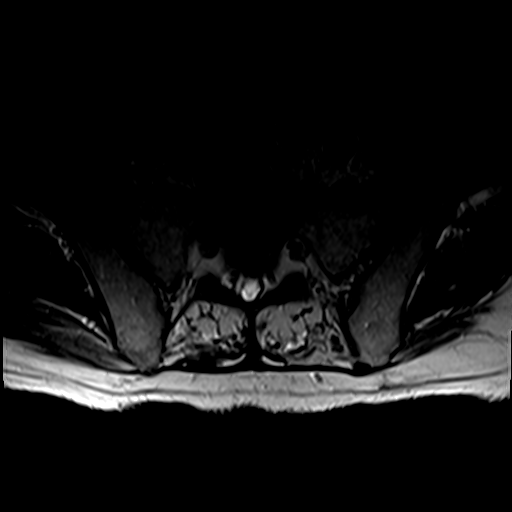
[im 6/42]
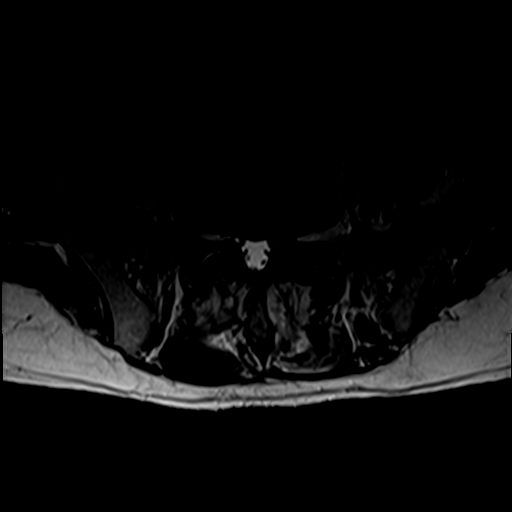
[im 12/42]
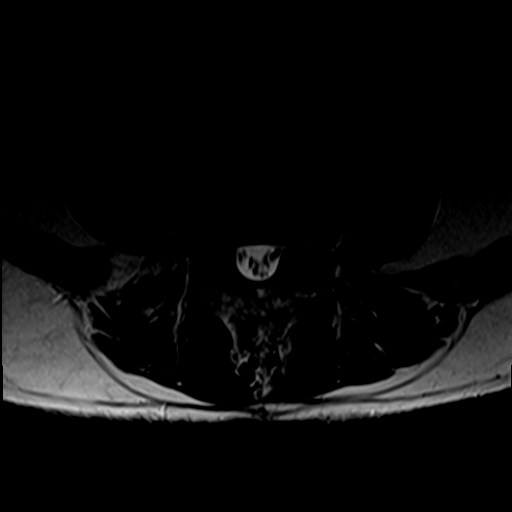
[im 18/42]
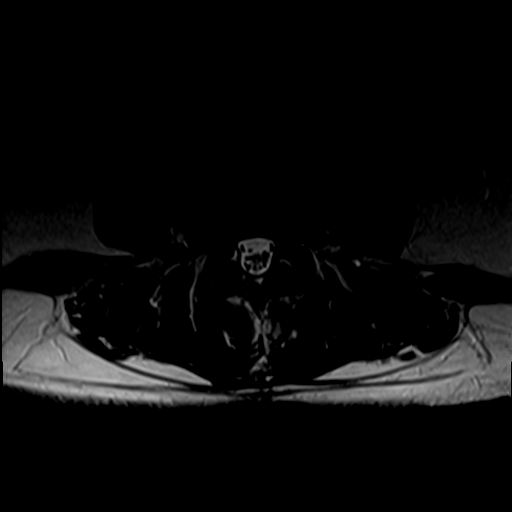
[im 21/42]
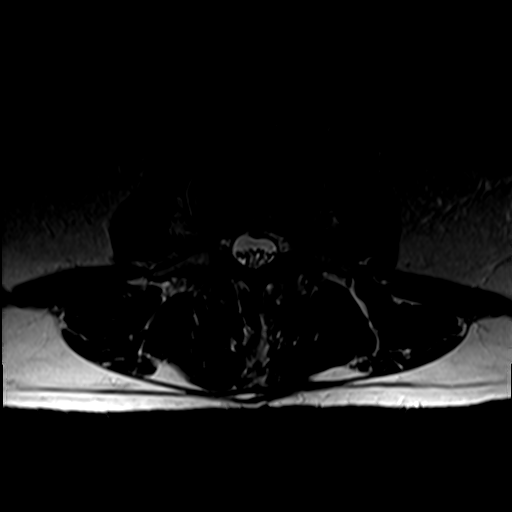
[im 24/42]
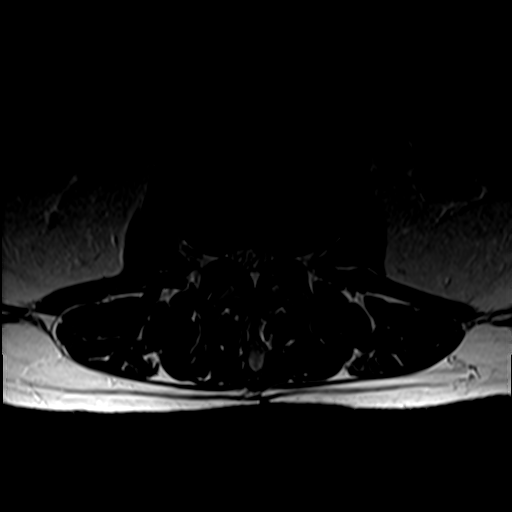
[im 30/42]
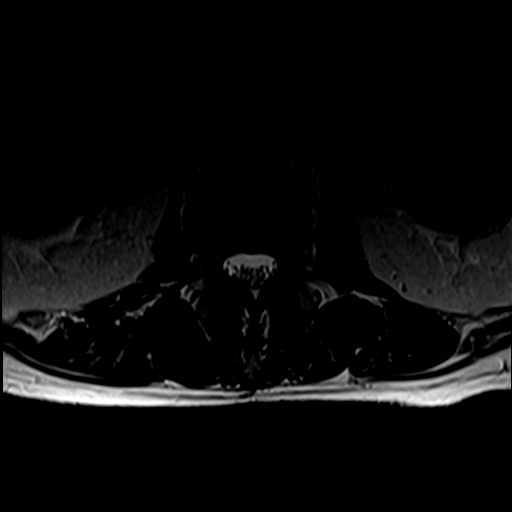
[im 36/42]
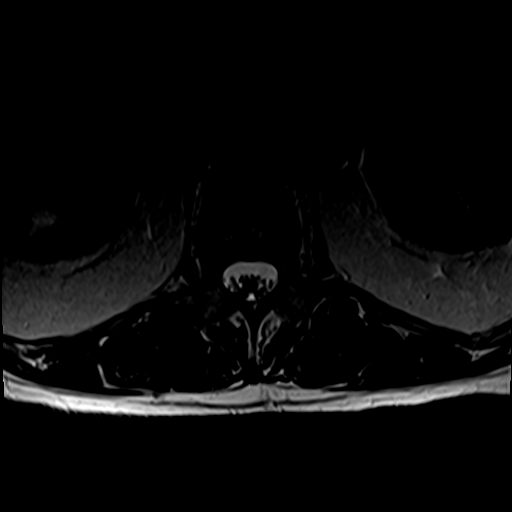
[im 42/42]
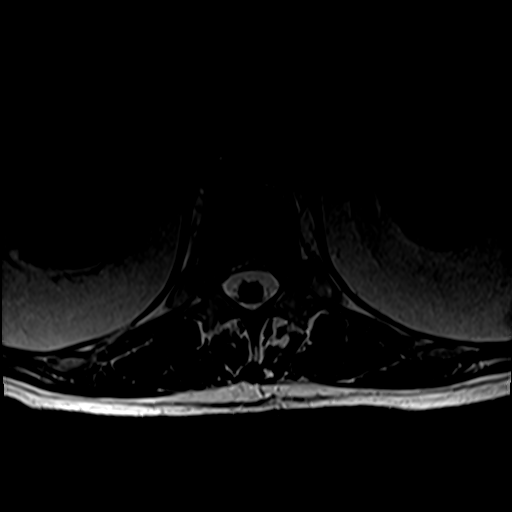

[Series 7: T1 · axial · 4.0mm · 0.39mm/px · z∈[-55,+134]mm · 6 of 42 slices shown (2 of 2)]
[im 1/42]
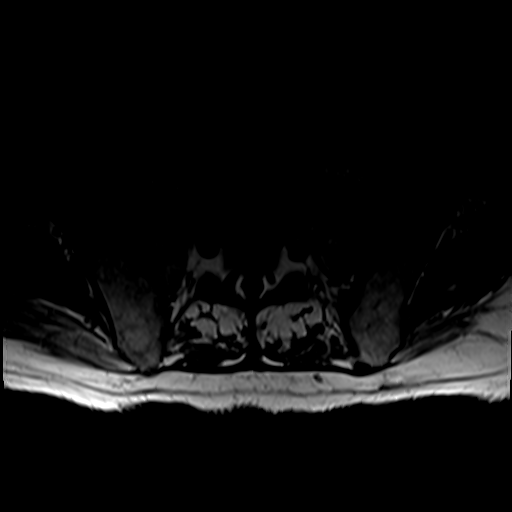
[im 6/42]
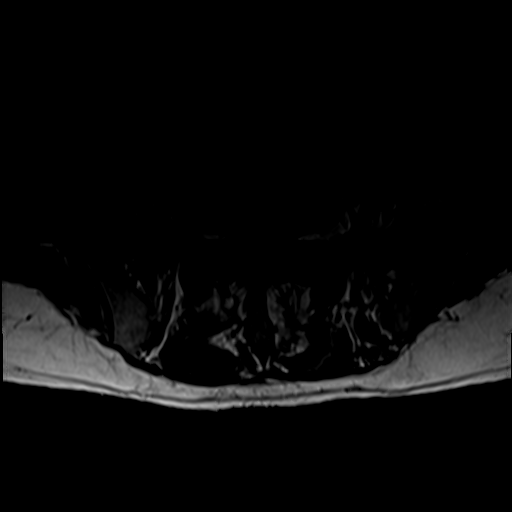
[im 12/42]
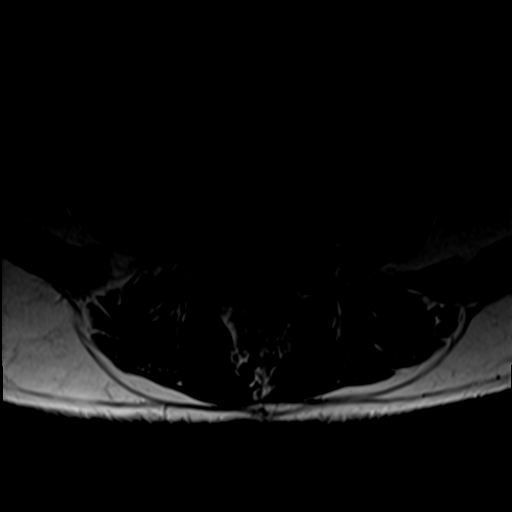
[im 18/42]
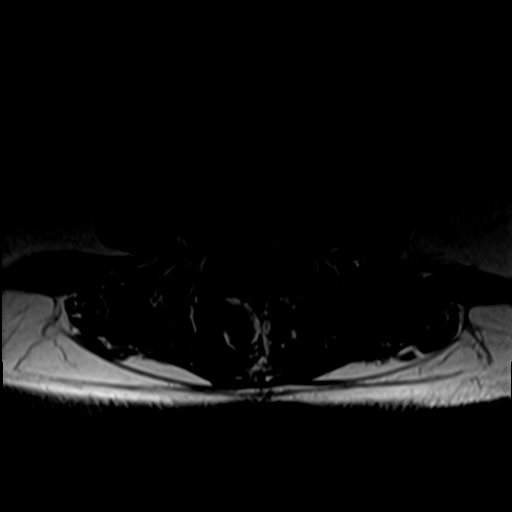
[im 21/42]
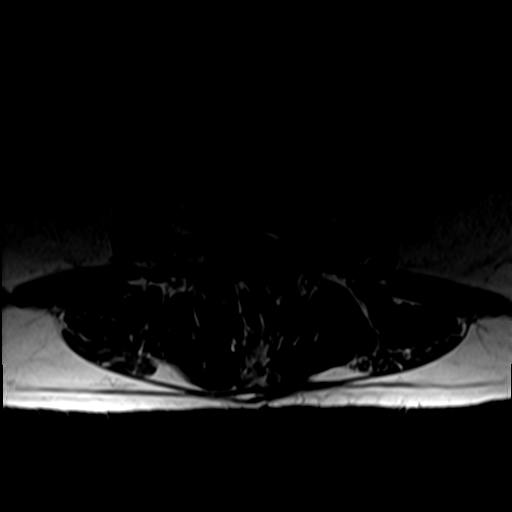
[im 36/42]
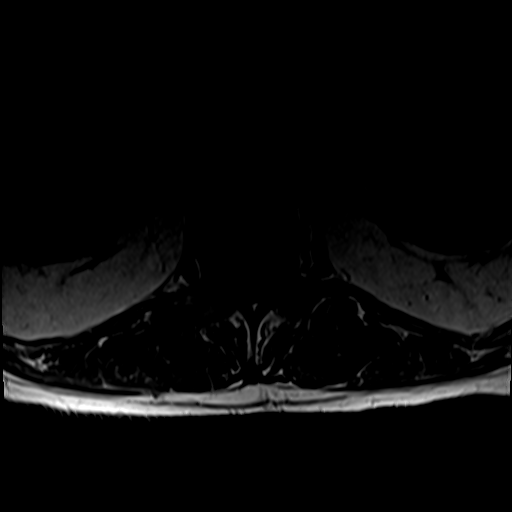

[27 of 48 positions shown; findings below may reference images not displayed]

FINDINGS: Segmentation:  5 lumbar type vertebrae when compared with prior.

Alignment: Slight anterolisthesis at L5-S1. Slight retrolisthesis at
L3-4.

Vertebrae:  No fracture, evidence of discitis, or bone lesion.

Conus medullaris and cauda equina: Conus extends to the L1 level.
Conus and cauda equina appear normal.

Paraspinal and other soft tissues: Renal cystic intensities.
Postoperative scarring at L3-4 and L4-5 posteriorly.

Disc levels:

T12- L1: Ventral spondylitic spurring

L1-L2: Ventral spondylitic spurring

L2-L3: Ventral spondylitic spurring. Disc narrowing and foraminal
predominant bulging. Mild facet spurring and ligamentum flavum
thickening. Mild spinal stenosis

L3-L4: Disc degeneration with foraminal predominant bulging and
ridging. Facet spurring. Patent canal after laminectomy. Mild
bilateral foraminal narrowing.

L4-L5: Greatest level of degenerative disc narrowing with endplate
ridging and disc bulging. Degenerative facet spurring. Patent canal
after laminectomy. Noncompressive bilateral foraminal stenosis that
is also stable.

L5-S1:Degenerative facet spurring with slight anterolisthesis. Mild
disc bulging. Mild triangular narrowing of the thecal sac without
static compression.
IMPRESSION: 1. Stable compared to [CU].
2. Good canal patency after laminectomy at L3 and L4.
3. Noncompressive foraminal narrowing greatest L4-5.

## 2020-10-16 ENCOUNTER — Other Ambulatory Visit: Payer: Self-pay | Admitting: Orthopedic Surgery

## 2020-10-16 DIAGNOSIS — G8929 Other chronic pain: Secondary | ICD-10-CM

## 2020-10-22 ENCOUNTER — Ambulatory Visit
Admission: RE | Admit: 2020-10-22 | Discharge: 2020-10-22 | Disposition: A | Discharge status: Home / Self Care | Payer: Medicare Other | Source: Ambulatory Visit | Attending: Orthopedic Surgery | Admitting: Orthopedic Surgery

## 2020-10-22 DIAGNOSIS — G8929 Other chronic pain: Secondary | ICD-10-CM

## 2020-10-22 DIAGNOSIS — M545 Low back pain, unspecified: Secondary | ICD-10-CM

## 2020-10-22 IMAGING — XA Imaging study
2 series · 2 of 2 positions shown · non-contrast
Comparison: none

CLINICAL DATA: 88-year-old male with lumbosacral spondylosis
without myelopathy. He recently underwent bilateral L4-L5 and L5-S1
facet injections performed by Dr. EDUART EMERLINDA on [DATE]. He has
experienced approximately 75% relief of his symptoms but has a
residual 25% discomfort in his low back, thighs and calves when
walking. He has a history of prior L3-L4 and L4-L5 decompression
with laminectomy. He presents today for L2-L3 epidural steroid
injection.

[Series 1: ortho standard · 1 of 1 slices shown (1 of 2)]
[im 1/1]
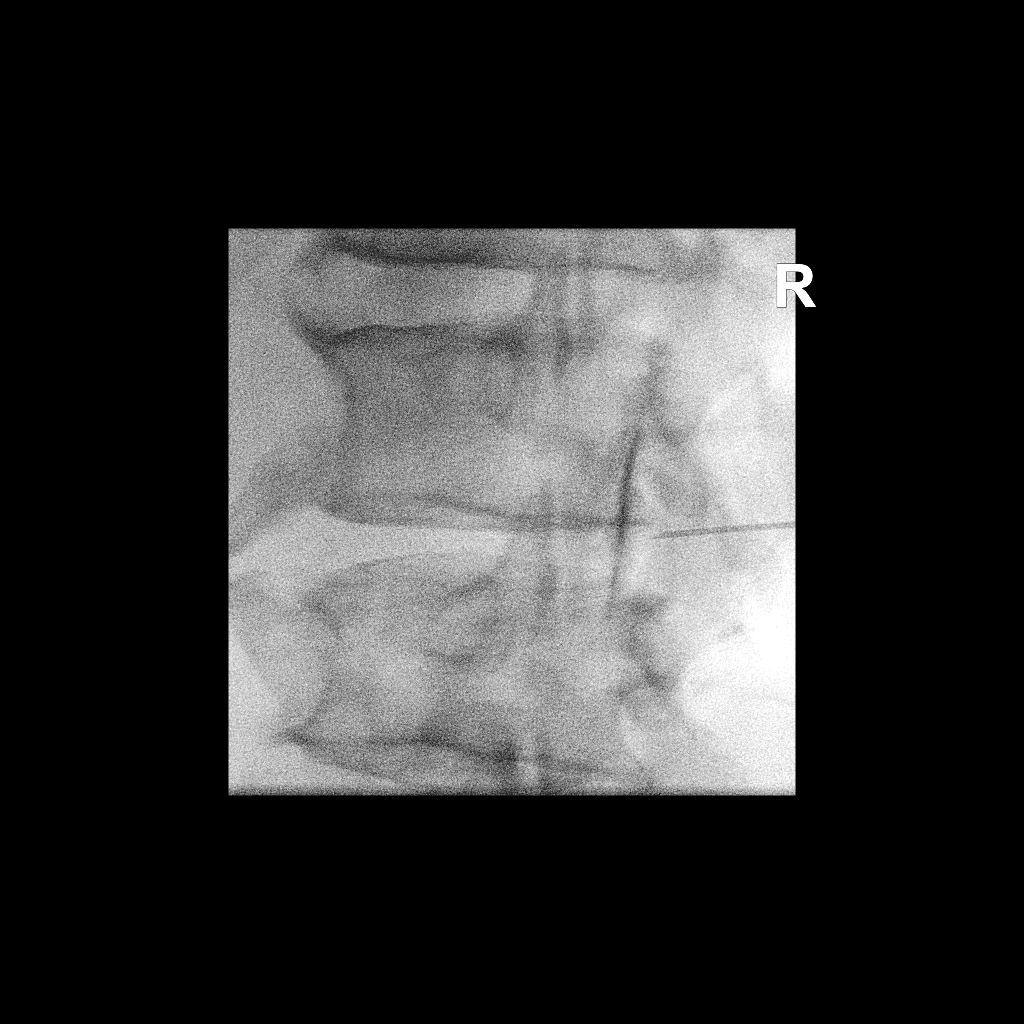

[Series 2: ortho standard · 1 of 1 slices shown (2 of 2)]
[im 1/1]
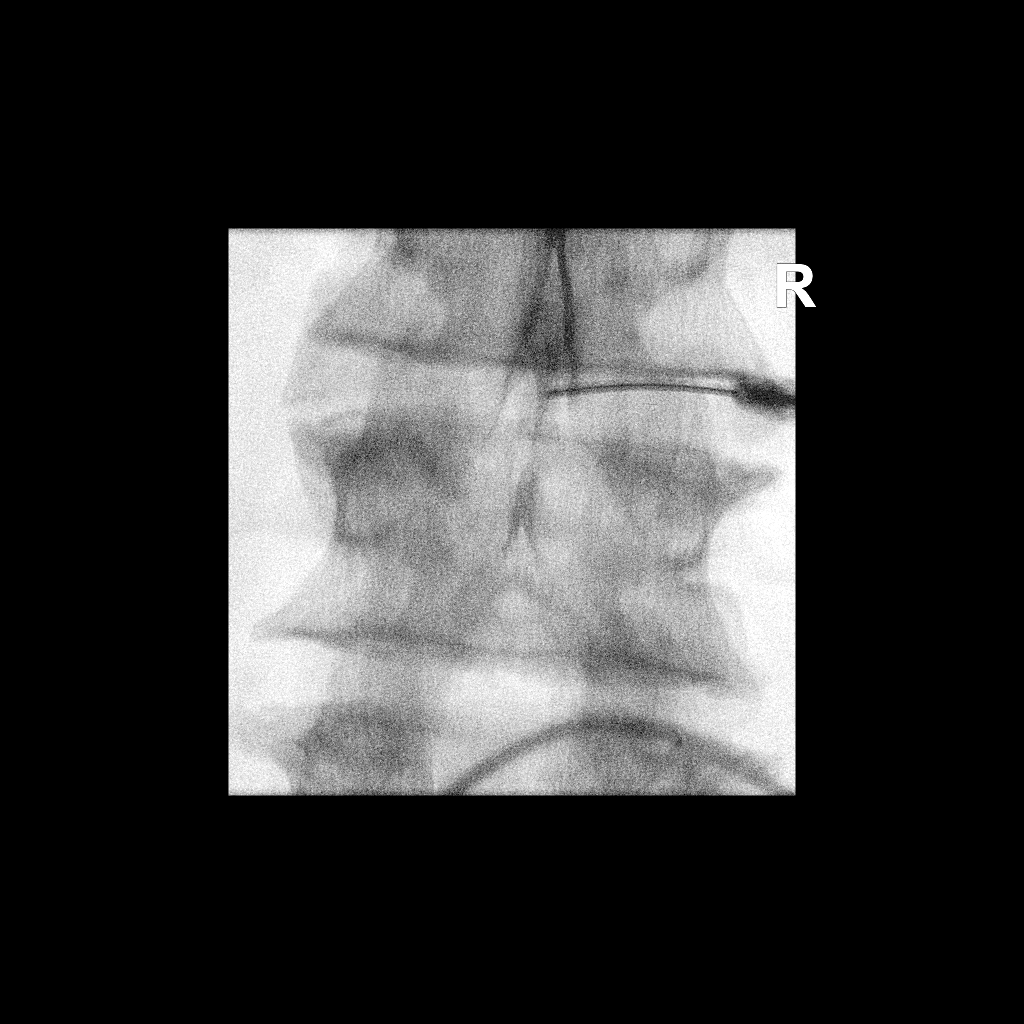

[2 of 2 positions shown; findings below may reference images not displayed]

FLUOROSCOPY TIME:  0 minutes 46 seconds 4.2 mGy

PROCEDURE:
The procedure, risks, benefits, and alternatives were explained to
the patient. Questions regarding the procedure were encouraged and
answered. The patient understands and consents to the procedure.

LUMBAR EPIDURAL INJECTION:

An interlaminar approach was performed on right at L2-L3. The
overlying skin was cleansed and anesthetized. A 20 gauge epidural
needle was advanced using loss-of-resistance technique.

DIAGNOSTIC EPIDURAL INJECTION:

Injection of Isovue-M 200 shows a good epidural pattern with spread
above and below the level of needle placement, primarily on the
right no vascular opacification is seen.

THERAPEUTIC EPIDURAL INJECTION:

120 mg of Depo-Medrol mixed with 2 mL 1% lidocaine were instilled.
The procedure was well-tolerated, and the patient was discharged
thirty minutes following the injection in good condition.

COMPLICATIONS:
None
IMPRESSION: Technically successful epidural injection on the right L2-L3.

## 2020-10-22 MED ORDER — IOPAMIDOL (ISOVUE-M 200) INJECTION 41%
1.0000 mL | Freq: Once | INTRAMUSCULAR | Status: AC
  Administered 2020-10-22: 1 mL via EPIDURAL

## 2020-10-22 MED ORDER — METHYLPREDNISOLONE ACETATE 40 MG/ML INJ SUSP (RADIOLOG
120.0000 mg | Freq: Once | INTRAMUSCULAR | Status: AC
  Administered 2020-10-22: 120 mg via EPIDURAL

## 2020-10-22 NOTE — Discharge Instructions (Signed)

## 2020-11-18 ENCOUNTER — Other Ambulatory Visit: Payer: Self-pay

## 2020-11-18 DIAGNOSIS — I739 Peripheral vascular disease, unspecified: Secondary | ICD-10-CM

## 2020-11-21 ENCOUNTER — Ambulatory Visit (INDEPENDENT_AMBULATORY_CARE_PROVIDER_SITE_OTHER): Payer: Medicare Other | Admitting: Vascular Surgery

## 2020-11-21 ENCOUNTER — Other Ambulatory Visit: Payer: Self-pay

## 2020-11-21 ENCOUNTER — Encounter: Payer: Self-pay | Admitting: Vascular Surgery

## 2020-11-21 ENCOUNTER — Ambulatory Visit (HOSPITAL_COMMUNITY)
Admission: RE | Admit: 2020-11-21 | Discharge: 2020-11-21 | Disposition: A | Discharge status: Home / Self Care | Payer: Medicare Other | Source: Ambulatory Visit | Attending: Vascular Surgery | Admitting: Vascular Surgery

## 2020-11-21 VITALS — BP 190/98 | HR 71 | Temp 98.3°F | Resp 20 | Ht 67.5 in | Wt 159.0 lb

## 2020-11-21 DIAGNOSIS — I739 Peripheral vascular disease, unspecified: Secondary | ICD-10-CM

## 2020-11-21 NOTE — Progress Notes (Signed)
Referring Physician: Dr. Lynann Bologna  Patient name: Corey [name] MRN: [id number] DOB: November 11, 1931 Sex: male  REASON FOR CONSULT: Claudication  HPI: Corey [name] is a 87 y.o. male, with history of spinal stenosis.  He underwent operative repair of this by Dr. Lynann Bologna in March 2021.  The patient states he was limping on his right leg and this did improve.  He still describes a cramping sensation in his calf when he walks about 15 minutes.  This improves with rest.  He is able to go for about 30 minutes every day.  He also complains of some hip and thigh pain with walking.  He also complains of temperature sensation changes primarily on the medial aspect of his calf which will be a hot and cold sensation.  He does not really completely feel debilitated by his symptoms but they are a nuisance to him.  He does take aspirin 81 mg once daily.  He has an exercise regimen and a very healthy diet to which he adheres.  He was on a statin in the past but did not want to take extra pills so he is not currently taking this.  He is a former smoker but quit 50 years ago.  He does not have any nonhealing wounds.  He does not describe rest pain in either foot.    Past Medical History:  Diagnosis Date  . Allergic asthma 1957   "had to leave [place]"  . Anxiety   . Arthritis    "neck, hips" (02/17/2017)  . Chronic cervical pain   . Chronic hip pain    "both hips"  . Chronic lower back pain   . Daily headache    "fairly constant; caused by spurs in my neck" (02/17/2017)  . GERD (gastroesophageal reflux disease)   . Hypertension   . Prostatitis   . Spinal stenosis    Past Surgical History:  Procedure Laterality Date  . LUMBAR LAMINECTOMY/DECOMPRESSION MICRODISCECTOMY N/A 10/19/2019   Procedure: LUMBAR 3- LUMBAR 5 DECOMPRESSION;  Surgeon: Phylliss Bob, MD;  Location: [facility];  Service: Orthopedics;  Laterality: N/A;  . MELANOMA EXCISION     "pre-melanoma taken off my back"  . TONSILLECTOMY       Family History  Problem Relation Age of Onset  . Hypertension Father     SOCIAL HISTORY: Social History   Socioeconomic History  . Marital status: Married    Spouse name: Not on file  . Number of children: 1  . Years of education: 61  . Highest education level: Not on file  Occupational History  . Not on file  Tobacco Use  . Smoking status: Former Smoker    Years: 2.00    Types: Cigars    Quit date: 1963    Years since quitting: 59.3  . Smokeless tobacco: Never Used  Vaping Use  . Vaping Use: Never used  Substance and Sexual Activity  . Alcohol use: No  . Drug use: No  . Sexual activity: Yes  Other Topics Concern  . Not on file  Social History Narrative   Lives with spouse   Caffeine use: 1 cup tea per day   Right handed    Social Determinants of Health   Financial Resource Strain: Not on file  Food Insecurity: Not on file  Transportation Needs: Not on file  Physical Activity: Not on file  Stress: Not on file  Social Connections: Not on file  Intimate Partner Violence: Not on file  Allergies  Allergen Reactions  . Tizanidine Anaphylaxis and Other (See Comments)    Complete heart block, syncope   . Zanaflex [Tizanidine Hcl] Other (See Comments)    Severe bradycardia   . Gabapentin Other (See Comments)    Shaking chills At 300 mg bid  . Ciprofloxacin Itching and Rash  . Penicillins Itching and Rash    Has patient had a PCN reaction causing immediate rash, facial/tongue/throat swelling, SOB or lightheadedness with hypotension:yes Has patient had a PCN reaction causing severe rash involving mucus membranes or skin necrosis:no Has patient had a PCN reaction that required hospitalization:no Has patient had a PCN reaction occurring within the last 10 years:no If all of the above answers are "NO", then may proceed with Cephalosporin use.   . Sulfa Antibiotics Itching and Rash    Current Outpatient Medications  Medication Sig Dispense Refill  .  Glucosamine HCl (GLUCOSAMINE PO) Take 1,500 mg by mouth 2 (two) times daily.     Marland Kitchen LORazepam (ATIVAN) 0.5 MG tablet Take 0.25 mg by mouth 2 (two) times daily as needed for anxiety. Max 0.75 mg daily    . naproxen (NAPROSYN) 375 MG tablet TAKE 1 TABLET BY MOUTH TWICE DAILY WITH MEALS AS NEEDED FOR ARTHRITIS PAIN    . Potassium 99 MG TABS Take 99 mg by mouth daily after lunch.     . tamsulosin (FLOMAX) 0.4 MG CAPS capsule Take 0.4 mg by mouth at bedtime.     Marland Kitchen telmisartan (MICARDIS) 80 MG tablet Take one tablet every morning for blood pressure control    . Turmeric 500 MG CAPS Take by mouth.    . vitamin C (ASCORBIC ACID) 500 MG tablet Take 500 mg by mouth 2 (two) times daily.     No current facility-administered medications for this visit.    ROS:   General:  No weight loss, Fever, chills  HEENT: No recent headaches, no nasal bleeding, no visual changes, no sore throat  Neurologic: No dizziness, blackouts, seizures. No recent symptoms of stroke or mini- stroke. No recent episodes of slurred speech, or temporary blindness.  Cardiac: No recent episodes of chest pain/pressure, no shortness of breath at rest.  No shortness of breath with exertion.  Denies history of atrial fibrillation or irregular heartbeat  Vascular: No history of rest pain in feet.  No history of claudication.  No history of non-healing ulcer, No history of DVT   Pulmonary: No home oxygen, no productive cough, no hemoptysis,  No asthma or wheezing  Musculoskeletal:  [X]  Arthritis, [X]  Low back pain,  [X]  Joint pain  Hematologic:No history of hypercoagulable state.  No history of easy bleeding.  No history of anemia  Gastrointestinal: No hematochezia or melena,  No gastroesophageal reflux, no trouble swallowing  Urinary: [ ]  chronic Kidney disease, [ ]  on HD - [ ]  MWF or [ ]  TTHS, [ ]  Burning with urination, [ ]  Frequent urination, [ ]  Difficulty urinating;   Skin: No rashes  Psychological: No history of anxiety,  No  history of depression   Physical Examination  Vitals:   11/21/20 0835  BP: (!) 190/98  Pulse: 71  Resp: 20  Temp: 98.3 F (36.8 C)  SpO2: 98%  Weight: 159 lb (72.1 kg)  Height: 5' 7.5" (1.715 m)    Body mass index is 24.54 kg/m.  General:  Alert and oriented, no acute distress HEENT: Normal Neck: No JVD Cardiac: Regular Rate and Rhythm Abdomen: Soft, non-tender, non-distended, no mass Skin: No rash  Extremity Pulses:  2+ radial, brachial, femoral, popliteal absent dorsalis pedis, posterior tibial pulses bilaterally Musculoskeletal: No deformity or edema  Neurologic: Upper and lower extremity motor 5/5 and symmetric  DATA: Patient had bilateral ABIs performed today.  Right side was 0.57 left side was 0.79 toe pressure was greater than 110 bilaterally.  I reviewed and interpreted the study.  ASSESSMENT: Patient with bilateral hip thigh calf pain and temperature sensation changes.  Most likely the symptoms are multifactorial with a component of his spinal disease as well as peripheral arterial disease.  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  His ABIs are reasonable although certainly low enough to explain his claudication symptoms.  I had a lengthy discussion with the patient today that he is not currently at risk of limb loss of the amount of perfusion that he currently is receiving.  His risk of limb loss lifetime will be less than 5%.  We discussed the possibility of an intervention to improve his walking distance.  However, this would most likely involve a tibial artery intervention which would not necessarily have a great durability and certainly could increase risk of limb loss long-term if the vessel occludes.  We also discussed the options of conservative therapy with a walking program and continue to increase his walking distance.   PLAN: Patient is going to try to increase his walking daily program to 45 minutes to 1 hour/day to improve  collateralization and tolerance of his muscles from his peripheral arterial disease.  He will follow-up with repeat ABIs and bilateral lower extremity duplex exam in 3 months time in our APP clinic.  Ruta Hinds, MD [facility] Office: [phone]

## 2020-11-27 ENCOUNTER — Other Ambulatory Visit: Payer: Self-pay

## 2020-11-27 DIAGNOSIS — I739 Peripheral vascular disease, unspecified: Secondary | ICD-10-CM

## 2021-02-20 ENCOUNTER — Ambulatory Visit (HOSPITAL_COMMUNITY)
Admission: RE | Admit: 2021-02-20 | Discharge: 2021-02-20 | Disposition: A | Discharge status: Home / Self Care | Payer: Medicare Other | Source: Ambulatory Visit | Attending: Vascular Surgery | Admitting: Vascular Surgery

## 2021-02-20 ENCOUNTER — Ambulatory Visit (INDEPENDENT_AMBULATORY_CARE_PROVIDER_SITE_OTHER): Payer: Medicare Other | Admitting: Physician Assistant

## 2021-02-20 ENCOUNTER — Other Ambulatory Visit: Payer: Self-pay

## 2021-02-20 ENCOUNTER — Ambulatory Visit (INDEPENDENT_AMBULATORY_CARE_PROVIDER_SITE_OTHER)
Admission: RE | Admit: 2021-02-20 | Discharge: 2021-02-20 | Disposition: A | Discharge status: Home / Self Care | Payer: Medicare Other | Source: Ambulatory Visit | Attending: Vascular Surgery | Admitting: Vascular Surgery

## 2021-02-20 VITALS — BP 204/93 | HR 70 | Temp 98.0°F | Resp 20 | Ht 67.5 in | Wt 160.2 lb

## 2021-02-20 DIAGNOSIS — I739 Peripheral vascular disease, unspecified: Secondary | ICD-10-CM

## 2021-02-20 NOTE — Progress Notes (Signed)
HISTORY AND PHYSICAL     CC:  follow up. Requesting Provider:  Christain Sacramento, MD  HPI: This is a 87 y.o. male who is here today for follow up for PAD.   with history of spinal stenosis.  He underwent operative repair of this by Dr. Lynann Bologna in March 2021.    Pt was last seen 11/21/2020 by Dr. Oneida Alar and at that time, pt stated he was limping on the right leg but improved.  He was having cramping sensation in the calf after walking for 15 minutes that improved with rest.  He was having some hip and thigh pain with walking as well.  He had complaints of temperature sensation changes on the medial aspect of the calf with hot and cold.  He did not feel completely debilitated by his sx but were an nuisance to home.  He was on asa and exercised regularly and on a healthy diet.    The pt returns today for follow up.  He states that Dr. Oneida Alar recommended that he increase his walking up to 1 hour every day.  He has been able to do this and states that his symptoms have improved.  Previously he was walking 30 minutes and would have to stop near the end of that 30 minutes due to cramping.  Now, he can walk almost an hour and he only has to stop near the end of the hour before he finishes.  He does not have any non healing wounds or rest pain.  He does have some pain on the anterior portion of his legs.  He does have some mild numbness in his feet.   He continues to take his aspirin.  He states that instead of taking a statin, he has started drinking beet juice.    His blood pressure is elevated today.  He tells me that he has white coat syndrome and it is always high at the doctor.  He takes his blood pressure regularly at home and has brought his documentation with him.  His blood pressure is consistently in the 130's-140's at home.    The pt is not on a statin for cholesterol management.    The pt is on an aspirin.    Other AC:  none The pt is on ARB for hypertension.  The pt does not have  diabetes. Tobacco hx:  former cigar smoker-quit in 1963  Pt does not have family hx of AAA.  Past Medical History:  Diagnosis Date   Allergic asthma 1957   "had to leave [place]"   Anxiety    Arthritis    "neck, hips" (02/17/2017)   Chronic cervical pain    Chronic hip pain    "both hips"   Chronic lower back pain    Daily headache    "fairly constant; caused by spurs in my neck" (02/17/2017)   GERD (gastroesophageal reflux disease)    Hypertension    Prostatitis    Spinal stenosis     Past Surgical History:  Procedure Laterality Date   LUMBAR LAMINECTOMY/DECOMPRESSION MICRODISCECTOMY N/A 10/19/2019   Procedure: LUMBAR 3- LUMBAR 5 DECOMPRESSION;  Surgeon: Phylliss Bob, MD;  Location: [facility];  Service: Orthopedics;  Laterality: N/A;   MELANOMA EXCISION     "pre-melanoma taken off my back"   TONSILLECTOMY      Allergies  Allergen Reactions   Tizanidine Anaphylaxis and Other (See Comments)    Complete heart block, syncope    Zanaflex [Tizanidine Hcl] Other (See Comments)  Severe bradycardia    Gabapentin Other (See Comments)    Shaking chills At 300 mg bid   Ciprofloxacin Itching and Rash   Penicillins Itching and Rash    Has patient had a PCN reaction causing immediate rash, facial/tongue/throat swelling, SOB or lightheadedness with hypotension:yes Has patient had a PCN reaction causing severe rash involving mucus membranes or skin necrosis:no Has patient had a PCN reaction that required hospitalization:no Has patient had a PCN reaction occurring within the last 10 years:no If all of the above answers are "NO", then may proceed with Cephalosporin use.    Sulfa Antibiotics Itching and Rash    Current Outpatient Medications  Medication Sig Dispense Refill   Glucosamine HCl (GLUCOSAMINE PO) Take 1,500 mg by mouth 2 (two) times daily.      LORazepam (ATIVAN) 0.5 MG tablet Take 0.25 mg by mouth 2 (two) times daily as needed for anxiety. Max 0.75 mg daily     naproxen  (NAPROSYN) 375 MG tablet TAKE 1 TABLET BY MOUTH TWICE DAILY WITH MEALS AS NEEDED FOR ARTHRITIS PAIN     Potassium 99 MG TABS Take 99 mg by mouth daily after lunch.      tamsulosin (FLOMAX) 0.4 MG CAPS capsule Take 0.4 mg by mouth at bedtime.      telmisartan (MICARDIS) 80 MG tablet Take one tablet every morning for blood pressure control     Turmeric 500 MG CAPS Take by mouth.     vitamin C (ASCORBIC ACID) 500 MG tablet Take 500 mg by mouth 2 (two) times daily.     No current facility-administered medications for this visit.    Family History  Problem Relation Age of Onset   Hypertension Father     Social History   Socioeconomic History   Marital status: Married    Spouse name: Not on file   Number of children: 1   Years of education: 12   Highest education level: Not on file  Occupational History   Not on file  Tobacco Use   Smoking status: Former    Types: Cigars    Quit date: 1963    Years since quitting: 59.6   Smokeless tobacco: Never  Vaping Use   Vaping Use: Never used  Substance and Sexual Activity   Alcohol use: No   Drug use: No   Sexual activity: Yes  Other Topics Concern   Not on file  Social History Narrative   Lives with spouse   Caffeine use: 1 cup tea per day   Right handed    Social Determinants of Health   Financial Resource Strain: Not on file  Food Insecurity: Not on file  Transportation Needs: Not on file  Physical Activity: Not on file  Stress: Not on file  Social Connections: Not on file  Intimate Partner Violence: Not on file     REVIEW OF SYSTEMS:   '[X]'$  denotes positive finding, '[ ]'$  denotes negative finding Cardiac  Comments:  Chest pain or chest pressure:    Shortness of breath upon exertion:    Short of breath when lying flat:    Irregular heart rhythm:        Vascular    Pain in calf, thigh, or hip brought on by ambulation: x   Pain in feet at night that wakes you up from your sleep:     Blood clot in your veins:    Leg  swelling:         Pulmonary  Oxygen at home:    Productive cough:     Wheezing:         Neurologic    Sudden weakness in arms or legs:     Sudden numbness in arms or legs:     Sudden onset of difficulty speaking or slurred speech:    Temporary loss of vision in one eye:     Problems with dizziness:         Gastrointestinal    Blood in stool:     Vomited blood:         Genitourinary    Burning when urinating:     Blood in urine:        Psychiatric    Major depression:         Hematologic    Bleeding problems:    Problems with blood clotting too easily:        Skin    Rashes or ulcers:        Constitutional    Fever or chills:      PHYSICAL EXAMINATION:  Today's Vitals   02/20/21 0928  BP: (!) 204/93  Pulse: 70  Resp: 20  Temp: 98 F (36.7 C)  TempSrc: Temporal  SpO2: 98%  Weight: 160 lb 3.2 oz (72.7 kg)  Height: 5' 7.5" (1.715 m)  PainSc: 8    Body mass index is 24.72 kg/m.   General:  WDWN in NAD; vital signs documented above Gait: Not observed HENT: WNL, normocephalic Pulmonary: normal non-labored breathing , without wheezing Cardiac: regular HR, without  Murmur; without carotid bruits Abdomen: soft, NT, no masses; aortic pulse is not palpable Skin: without rashes Vascular Exam/Pulses:  Right Left  Radial 2+ (normal) 2+ (normal)  Femoral 2+ (normal) 2+ (normal)  Popliteal Unable to palpate Unable to palpate  DP Unable to palpate Unable to palpate =  PT 2+ (normal) 1+ (weak)   Extremities: without ischemic changes, without Gangrene , without cellulitis; without open wounds;  Musculoskeletal: no muscle wasting or atrophy  Neurologic: A&O X 3;  No focal weakness or paresthesias are detected Psychiatric:  The pt has Normal affect.   Non-Invasive Vascular Imaging:   ABI's/TBI's on 02/20/2021: Right:  0.70/0.42 - Great toe pressure: 88 Left:  0.71/0.52 - Great toe pressure: 108  BLE Arterial duplex on  02/20/2021: +-----------+--------+-----+--------+--------+--------+  RIGHT      PSV cm/sRatioStenosisWaveformComments  +-----------+--------+-----+--------+--------+--------+  CFA Distal 131                  biphasic          +-----------+--------+-----+--------+--------+--------+  DFA        96                   biphasic          +-----------+--------+-----+--------+--------+--------+  SFA Prox   90                   biphasic          +-----------+--------+-----+--------+--------+--------+  SFA Mid    86                   biphasic          +-----------+--------+-----+--------+--------+--------+  SFA Distal 138                  biphasic          +-----------+--------+-----+--------+--------+--------+  POP Prox   95  biphasic          +-----------+--------+-----+--------+--------+--------+  POP Distal 39                   biphasic          +-----------+--------+-----+--------+--------+--------+  ATA Distal 31                   biphasic          +-----------+--------+-----+--------+--------+--------+  PTA Distal 36                   biphasic          +-----------+--------+-----+--------+--------+--------+  PERO Distal49                   biphasic          +-----------+--------+-----+--------+--------+--------+    +-----------+--------+-----+--------+--------+--------+  LEFT       PSV cm/sRatioStenosisWaveformComments  +-----------+--------+-----+--------+--------+--------+  CFA Distal 125                  biphasic          +-----------+--------+-----+--------+--------+--------+  DFA        89                   biphasic          +-----------+--------+-----+--------+--------+--------+  SFA Prox   107                  biphasic          +-----------+--------+-----+--------+--------+--------+  SFA Mid    81                   biphasic           +-----------+--------+-----+--------+--------+--------+  SFA Distal 81                   biphasic          +-----------+--------+-----+--------+--------+--------+  POP Prox   103                  biphasic          +-----------+--------+-----+--------+--------+--------+  POP Distal 53                   biphasic          +-----------+--------+-----+--------+--------+--------+  ATA Distal 61                   biphasic          +-----------+--------+-----+--------+--------+--------+  PTA Distal 43                   biphasic          +-----------+--------+-----+--------+--------+--------+  PERO Distal55                   biphasic          +-----------+--------+-----+--------+--------+--------+  Summary:  Right: Near normal examination. No evidence of significant arterial  occlusive disease.   Left: Near normal examination. No evidence of significant arterial  occlusive disease.   Previous ABI's/TBI's on 11/21/2020: Right:  0.57/0.51 - Great toe pressure: 110 Left:  0.79/0.54 - Great toe pressure:  116   ASSESSMENT/PLAN:: 87 y.o. male here for follow up for claudication  Claudication -pt has increased his walking program to one hour and his symptoms have improved.  His PT pulses are palpable on today's exam. He does not have any non healing wounds or rest pain.  His  ABI on the right is significantly improved and his waveforms are now biphasic and BLE arterial duplex is near normal.   -discussed with pt continuing his walking regimen.  Do not feel that is thigh symptoms are related to PAD given his femoral pulses are easily palpable.   -given the pt's symptoms have improved, discussed with options of follow up and he will follow up prn.  He knows to call with any concerns or if he develops non healing wounds or rest pain.  Encouraged him to continue walking program.  Hypertension -pt with significantly elevated blood pressure during today's visit.   He did have his BP log that he keeps daily at home.  His blood pressure is consistently in the 130's-140's at home.  Discussed with him to take his BP when he gets home.  If for some reason it continues to be significantly elevated, he will make appt with PCP.  Anticipate it will be improved at home given consistency of his his BP.    Leontine Locket, [facility] [phone]  Clinic MD:   Oneida Alar

## 2021-07-03 ENCOUNTER — Other Ambulatory Visit: Payer: Self-pay | Admitting: Orthopedic Surgery

## 2021-07-03 DIAGNOSIS — M545 Low back pain, unspecified: Secondary | ICD-10-CM

## 2021-07-07 ENCOUNTER — Ambulatory Visit
Admission: RE | Admit: 2021-07-07 | Discharge: 2021-07-07 | Disposition: A | Discharge status: Home / Self Care | Payer: Medicare Other | Source: Ambulatory Visit | Attending: Orthopedic Surgery | Admitting: Orthopedic Surgery

## 2021-07-07 DIAGNOSIS — M545 Low back pain, unspecified: Secondary | ICD-10-CM

## 2021-07-07 IMAGING — XA Imaging study
2 series · 2 of 2 positions shown · non-contrast
Comparison: none

CLINICAL DATA: Lumbosacral spondylosis without myelopathy.
Significant relief after the previous injection, without side effect
or complication. Partial recurrence of symptoms, left worse than
right. The patient wishes to repeat.

[Series 1: ortho standard · 1 of 1 slices shown (1 of 2)]
[im 1/1]
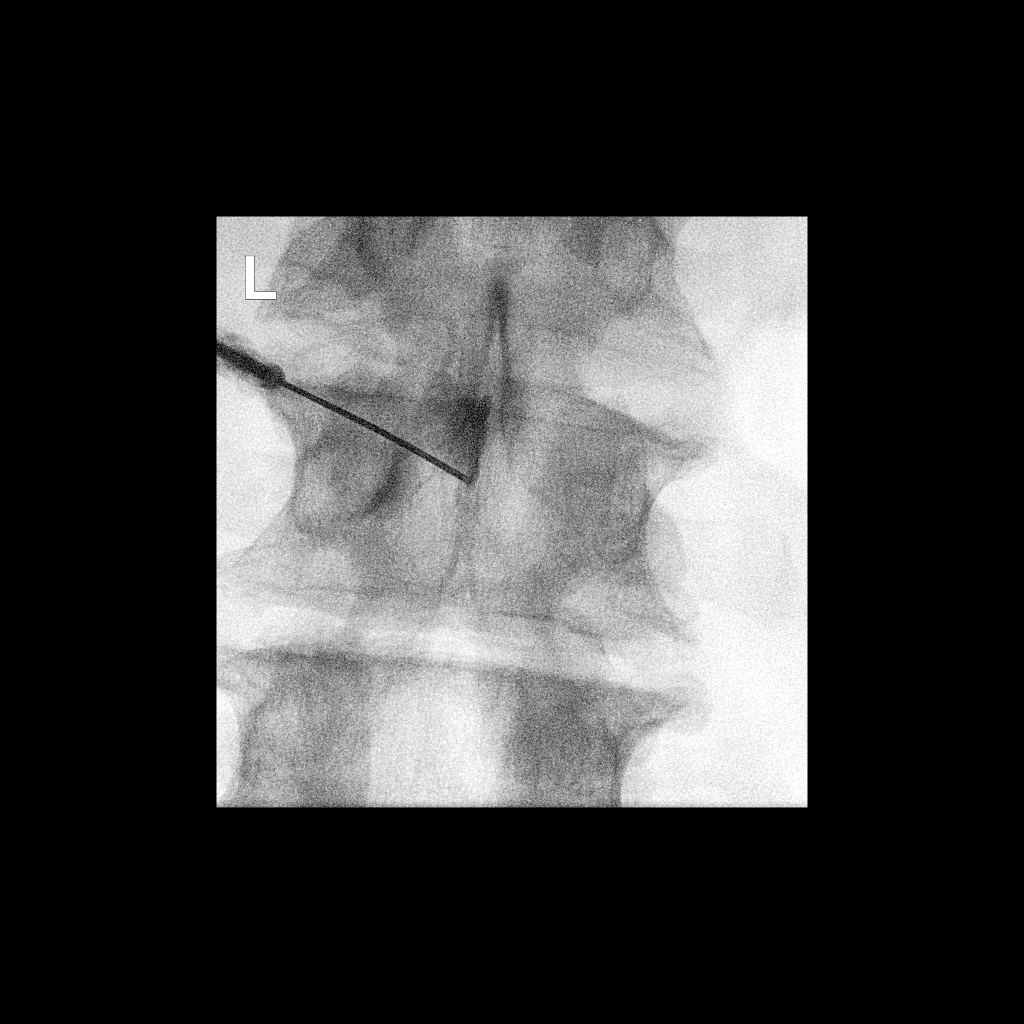

[Series 2: ortho standard · 1 of 1 slices shown (2 of 2)]
[im 1/1]
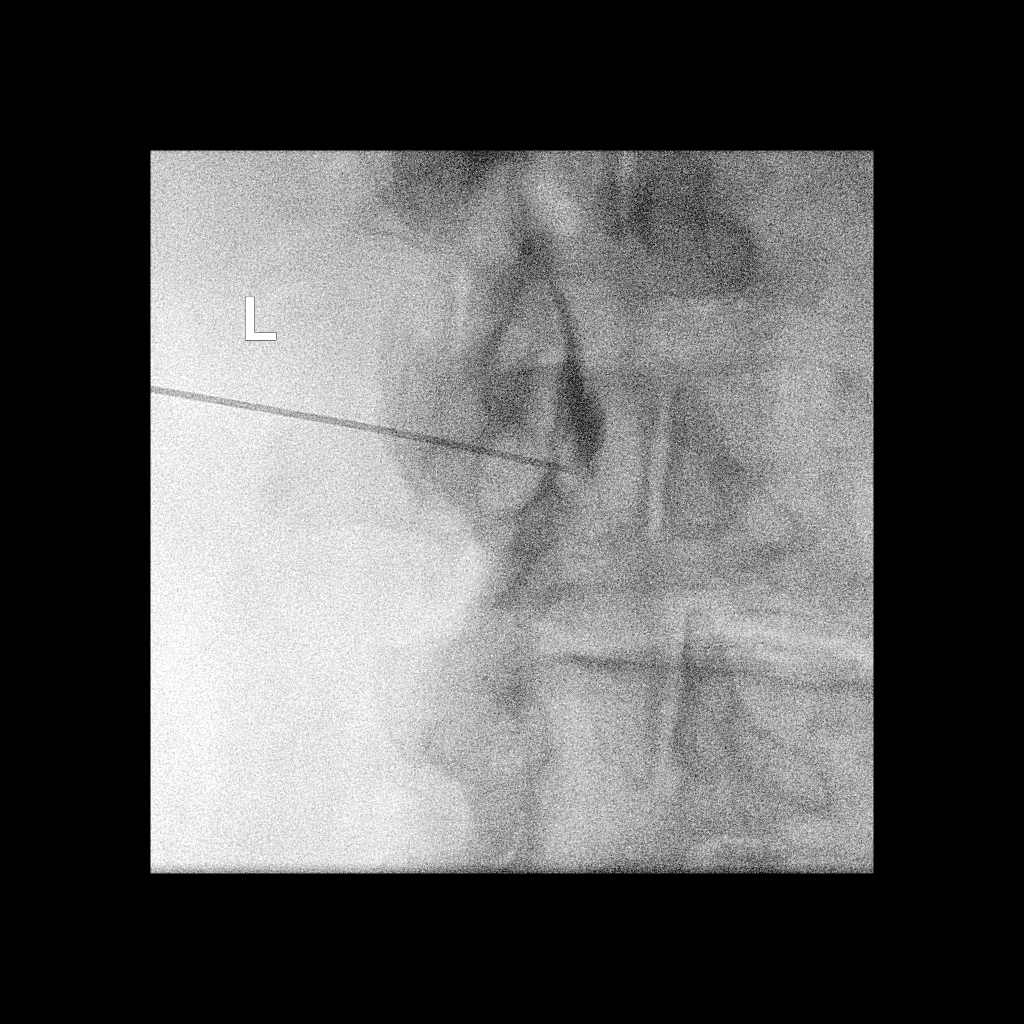

[2 of 2 positions shown; findings below may reference images not displayed]

EXAM:
LUMBAR EPIDURAL INJECTION:

DIAGNOSTIC EPIDURAL INJECTION:

THERAPEUTIC EPIDURAL INJECTION:

PROCEDURE:
The procedure, risks, benefits, and alternatives were explained to
the patient. Questions regarding the procedure were encouraged and
answered. The patient understands and consents to the procedure.

An interlaminar approach was performed on left at the L2-3. The
overlying skin was cleansed and anesthetized. A 20 gauge epidural
needle was advanced using loss-of-resistance technique.

Injection of Isovue-M 200 shows a good epidural pattern with spread
above and below the level of needle placement, primarily on the
left, and crossing midline. No vascular opacification is seen.

80mg of Depo-Medrol mixed with 2ml lidocaine 1% were instilled. The
procedure was well-tolerated, and the patient was discharged thirty
minutes following the injection in good condition.

FLUOROSCOPY TIME:  29 seconds; 29 [2N] DAP

COMPLICATIONS:
None immediate
IMPRESSION: Technically successful epidural injection on the left at L2-3.

## 2021-07-07 MED ORDER — METHYLPREDNISOLONE ACETATE 40 MG/ML INJ SUSP (RADIOLOG
80.0000 mg | Freq: Once | INTRAMUSCULAR | Status: AC
  Administered 2021-07-07: 80 mg via EPIDURAL

## 2021-07-07 MED ORDER — IOPAMIDOL (ISOVUE-M 300) INJECTION 61%
1.0000 mL | Freq: Once | INTRAMUSCULAR | Status: AC | PRN
  Administered 2021-07-07: 1 mL via EPIDURAL

## 2021-07-07 NOTE — Discharge Instructions (Signed)
Post Procedure Spinal Discharge Instruction Sheet  You may resume a regular diet and any medications that you routinely take (including pain medications) unless otherwise noted by MD.  No driving day of procedure.  Light activity throughout the rest of the day.  Do not do any strenuous work, exercise, bending or lifting.  The day following the procedure, you can resume normal physical activity but you should refrain from exercising or physical therapy for at least three days thereafter.  You may apply ice to the injection site, 20 minutes on, 20 minutes off, as needed. Do not apply ice directly to skin.    Common Side Effects:  Headaches- take your usual medications as directed by your physician.  Increase your fluid intake.  Caffeinated beverages may be helpful.  Lie flat in bed until your headache resolves.  Restlessness or inability to sleep- you may have trouble sleeping for the next few days.  Ask your referring physician if you need any medication for sleep.  Facial flushing or redness- should subside within a few days.  Increased pain- a temporary increase in pain a day or two following your procedure is not unusual.  Take your pain medication as prescribed by your referring physician.  Leg cramps  Please contact our office at [phone] for the following symptoms: Fever greater than 100 degrees. Headaches unresolved with medication after 2-3 days. Increased swelling, pain, or redness at injection site.   Thank you for visiting [facility] today.    YOU MAY RESUME YOUR ASPIRIN ANYTIME AFTER INJECTION

## 2021-10-20 NOTE — Progress Notes (Signed)
?HISTORY AND PHYSICAL  ? ? ? ?CC:  follow up. ?Requesting Provider:  Christain Sacramento, MD ? ?HPI: This is a 88 y.o. male who is here today for follow up for PAD.  He has hx of spinal stenosis and had hx of operative repair by Dr. Lynann Bologna in March 2021.  ? ?Pt was seen 11/21/2020 by Dr. Oneida Alar and at that time,  pt stated he was limping on the right leg but improved.  He was having cramping sensation in the calf after walking for 15 minutes that improved with rest.  He was having some hip and thigh pain with walking as well.  He had complaints of temperature sensation changes on the medial aspect of the calf with hot and cold.  He did not feel completely debilitated by his sx but were an nuisance to home.  He was on asa and exercised regularly and on a healthy diet.   ? ?He was last seen in July 2022 and he followed Dr. Oneida Alar recommendation of increasing his walking to an hour per day.  He did this and did have improvement in his sx.  He was having some mild numbness in his feet but did not have any non healing wounds or rest pain.  He had stopped taking a statin and started drinking beet juice. His BLE arterial duplex was near normal exam and ABI's were improved from previous visit.   ? ?The pt returns today for follow up and accompanied by his wife.  He states that he was able to walk for an hour but now he can walk about 15-20 minutes before having to stop before he starts to cramp and then walks another 15-20 minutes and the cramping resolves.  He states he does get injections in his back every 4 months and he is due for another one next month.  He denies any rest pain or non healing wounds.  I called to be seen because he was wanting to sort out if this issue was due to his back or blood flow.   ? ?The pt is not on a statin for cholesterol management.    ?The pt is not on an aspirin.    Other AC:  none ?The pt is on ARB for hypertension.  ?The pt does not have diabetes. ?Tobacco hx:  former ? ?Pt does not have  family hx of AAA. ? ?Past Medical History:  ?Diagnosis Date  ? Allergic asthma 1957  ? "had to leave [place]"  ? Anxiety   ? Arthritis   ? "neck, hips" (02/17/2017)  ? Chronic cervical pain   ? Chronic hip pain   ? "both hips"  ? Chronic lower back pain   ? Daily headache   ? "fairly constant; caused by spurs in my neck" (02/17/2017)  ? GERD (gastroesophageal reflux disease)   ? Hypertension   ? Prostatitis   ? Spinal stenosis   ? ? ?Past Surgical History:  ?Procedure Laterality Date  ? LUMBAR LAMINECTOMY/DECOMPRESSION MICRODISCECTOMY N/A 10/19/2019  ? Procedure: LUMBAR 3- LUMBAR 5 DECOMPRESSION;  Surgeon: Phylliss Bob, MD;  Location: [place];  Service: Orthopedics;  Laterality: N/A;  ? MELANOMA EXCISION    ? "pre-melanoma taken off my back"  ? TONSILLECTOMY    ? ? ?Allergies  ?Allergen Reactions  ? Tizanidine Anaphylaxis and Other (See Comments)  ?  Complete heart block, syncope ?  ? Zanaflex [Tizanidine Hcl] Other (See Comments)  ?  Severe bradycardia   ? Gabapentin Other (See  Comments)  ?  Shaking chills ?At 300 mg bid  ? Ciprofloxacin Itching and Rash  ? Penicillins Itching and Rash  ?  Has patient had a PCN reaction causing immediate rash, facial/tongue/throat swelling, SOB or lightheadedness with hypotension:yes ?Has patient had a PCN reaction causing severe rash involving mucus membranes or skin necrosis:no ?Has patient had a PCN reaction that required hospitalization:no ?Has patient had a PCN reaction occurring within the last 10 years:no ?If all of the above answers are "NO", then may proceed with Cephalosporin use. ?  ? Sulfa Antibiotics Itching and Rash  ? ? ?Current Outpatient Medications  ?Medication Sig Dispense Refill  ? Glucosamine HCl (GLUCOSAMINE PO) Take 1,500 mg by mouth 2 (two) times daily.     ? LORazepam (ATIVAN) 0.5 MG tablet Take 0.25 mg by mouth 2 (two) times daily as needed for anxiety. Max 0.75 mg daily    ? naproxen (NAPROSYN) 375 MG tablet TAKE 1 TABLET BY MOUTH TWICE DAILY WITH MEALS AS  NEEDED FOR ARTHRITIS PAIN    ? Potassium 99 MG TABS Take 99 mg by mouth daily after lunch.     ? tamsulosin (FLOMAX) 0.4 MG CAPS capsule Take 0.4 mg by mouth at bedtime.     ? telmisartan (MICARDIS) 80 MG tablet Take one tablet every morning for blood pressure control    ? Turmeric 500 MG CAPS Take by mouth.    ? vitamin C (ASCORBIC ACID) 500 MG tablet Take 500 mg by mouth 2 (two) times daily.    ? ?No current facility-administered medications for this visit.  ? ? ?Family History  ?Problem Relation Age of Onset  ? Hypertension Father   ? ? ?Social History  ? ?Socioeconomic History  ? Marital status: Married  ?  Spouse name: Not on file  ? Number of children: 1  ? Years of education: 80  ? Highest education level: Not on file  ?Occupational History  ? Not on file  ?Tobacco Use  ? Smoking status: Former  ?  Types: Cigars  ?  Quit date: 1963  ?  Years since quitting: 60.2  ? Smokeless tobacco: Never  ?Vaping Use  ? Vaping Use: Never used  ?Substance and Sexual Activity  ? Alcohol use: No  ? Drug use: No  ? Sexual activity: Yes  ?Other Topics Concern  ? Not on file  ?Social History Narrative  ? Lives with spouse  ? Caffeine use: 1 cup tea per day  ? Right handed   ? ?Social Determinants of Health  ? ?Financial Resource Strain: Not on file  ?Food Insecurity: Not on file  ?Transportation Needs: Not on file  ?Physical Activity: Not on file  ?Stress: Not on file  ?Social Connections: Not on file  ?Intimate Partner Violence: Not on file  ? ? ? ?REVIEW OF SYSTEMS:  ? ?'[X]'$  denotes positive finding, '[ ]'$  denotes negative finding ?Cardiac  Comments:  ?Chest pain or chest pressure:    ?Shortness of breath upon exertion:    ?Short of breath when lying flat:    ?Irregular heart rhythm:    ?    ?Vascular    ?Pain in calf, thigh, or hip brought on by ambulation: x See HPI  ?Pain in feet at night that wakes you up from your sleep:     ?Blood clot in your veins:    ?Leg swelling:     ?    ?Pulmonary    ?Oxygen at home:    ?Productive  cough:     ?Wheezing:     ?    ?Neurologic    ?Sudden weakness in arms or legs:     ?Sudden numbness in arms or legs:     ?Sudden onset of difficulty speaking or slurred speech:    ?Temporary loss of vision in one eye:     ?Problems with dizziness:     ?    ?Gastrointestinal    ?Blood in stool:     ?Vomited blood:     ?    ?Genitourinary    ?Burning when urinating:     ?Blood in urine:    ?    ?Psychiatric    ?Major depression:     ?    ?Hematologic    ?Bleeding problems:    ?Problems with blood clotting too easily:    ?    ?Skin    ?Rashes or ulcers:    ?    ?Constitutional    ?Fever or chills:    ? ? ?PHYSICAL EXAMINATION: ? ?Today's Vitals  ? 10/22/21 1030 10/22/21 1036  ?BP: (!) 193/87 (!) 193/89  ?Pulse: 70 70  ?Resp: 16   ?Temp: 98.2 ?F (36.8 ?C)   ?TempSrc: Temporal   ?SpO2: 100%   ?Weight: 158 lb (71.7 kg)   ?Height: '5\' 7"'$  (1.702 m)   ?PainSc: 10-Worst pain ever   ? ?Body mass index is 24.75 kg/m?. ? ? ?General:  WDWN in NAD; vital signs documented above ?Gait: Not observed ?HENT: WNL, normocephalic ?Pulmonary: normal non-labored breathing , without wheezing ?Cardiac: regular HR, without carotid bruits ?Abdomen: soft, NT, no masses; aortic pulse is not palpable ?Skin: without rashes ?Vascular Exam/Pulses: ? Right Left  ?Radial 2+ (normal) 2+ (normal)  ?Femoral 2+ (normal) 2+ (normal)  ?Popliteal Unable to palpate Unable to palpate  ?DP monophasic monophasic  ?PT monophasic monophasic  ?Peroneal monophasic monophasic  ? ?Extremities: without ischemic changes, without Gangrene , without cellulitis; without open wounds;  ?Musculoskeletal: no muscle wasting or atrophy  ?Neurologic: A&O X 3 ?Psychiatric:  The pt has Normal affect. ? ? ?Non-Invasive Vascular Imaging:   ?ABI's/TBI's on 10/22/2021: ?Right:  0.65/0.43 - Great toe pressure: 82 ?Left:  0.70/0.41 - Great toe pressure: 78 ? ? ?Previous ABI's/TBI's on 02/20/2021: ?Right:  0.70/0.42 - Great toe pressure: 88 ?Left:  0.71/0.52 - Great toe pressure:   108 ? ?Previous arterial duplex on 02/20/2021: ?BLE arterial duplex-near normal exam bilaterally and no evidence of significant arterial occlusive disease ? ? ?ASSESSMENT/PLAN:: 88 y.o. male here for follow up for PA

## 2021-10-22 ENCOUNTER — Other Ambulatory Visit: Payer: Self-pay

## 2021-10-22 ENCOUNTER — Ambulatory Visit (INDEPENDENT_AMBULATORY_CARE_PROVIDER_SITE_OTHER): Payer: Medicare Other | Admitting: Physician Assistant

## 2021-10-22 ENCOUNTER — Ambulatory Visit (HOSPITAL_COMMUNITY)
Admission: RE | Admit: 2021-10-22 | Discharge: 2021-10-22 | Disposition: A | Discharge status: Home / Self Care | Payer: Medicare Other | Source: Ambulatory Visit | Attending: Physician Assistant | Admitting: Physician Assistant

## 2021-10-22 ENCOUNTER — Encounter: Payer: Self-pay | Admitting: Physician Assistant

## 2021-10-22 VITALS — BP 193/89 | HR 70 | Temp 98.2°F | Resp 16 | Ht 67.0 in | Wt 158.0 lb

## 2021-10-22 DIAGNOSIS — I739 Peripheral vascular disease, unspecified: Secondary | ICD-10-CM | POA: Insufficient documentation

## 2021-10-30 ENCOUNTER — Other Ambulatory Visit: Payer: Self-pay | Admitting: Orthopedic Surgery

## 2021-10-30 DIAGNOSIS — M545 Low back pain, unspecified: Secondary | ICD-10-CM

## 2021-11-03 ENCOUNTER — Ambulatory Visit
Admission: RE | Admit: 2021-11-03 | Discharge: 2021-11-03 | Disposition: A | Discharge status: Home / Self Care | Payer: Medicare Other | Source: Ambulatory Visit | Attending: Orthopedic Surgery | Admitting: Orthopedic Surgery

## 2021-11-03 DIAGNOSIS — G8929 Other chronic pain: Secondary | ICD-10-CM

## 2021-11-03 IMAGING — XA Imaging study
2 series · 2 of 2 positions shown · non-contrast
Comparison: none

CLINICAL DATA: 89-year-old male with lumbosacral spondylosis
without myelopathy. He presents for right L2-L3 epidural steroid
injection.

[Series 1: ortho standard · 1 of 1 slices shown (1 of 2)]
[im 1/1]
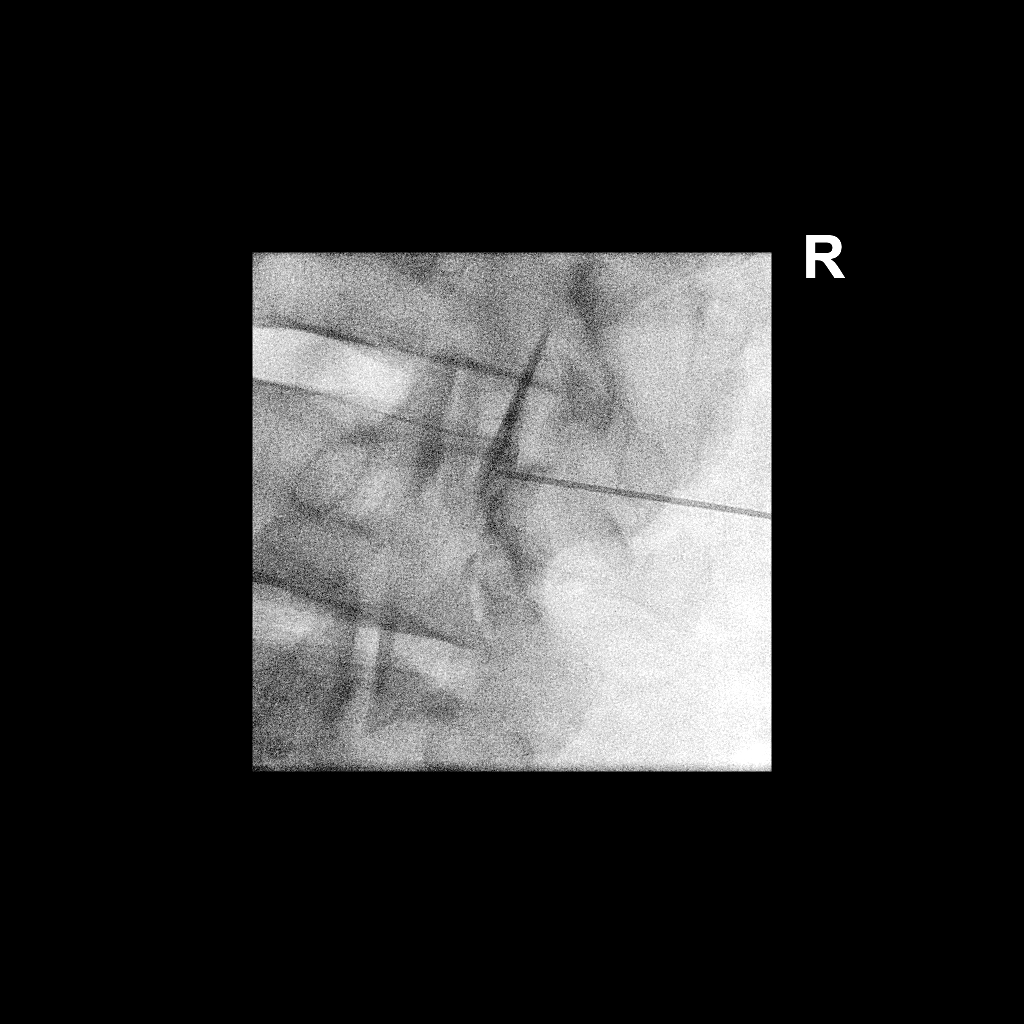

[Series 2: ortho standard · 1 of 1 slices shown (2 of 2)]
[im 1/1]
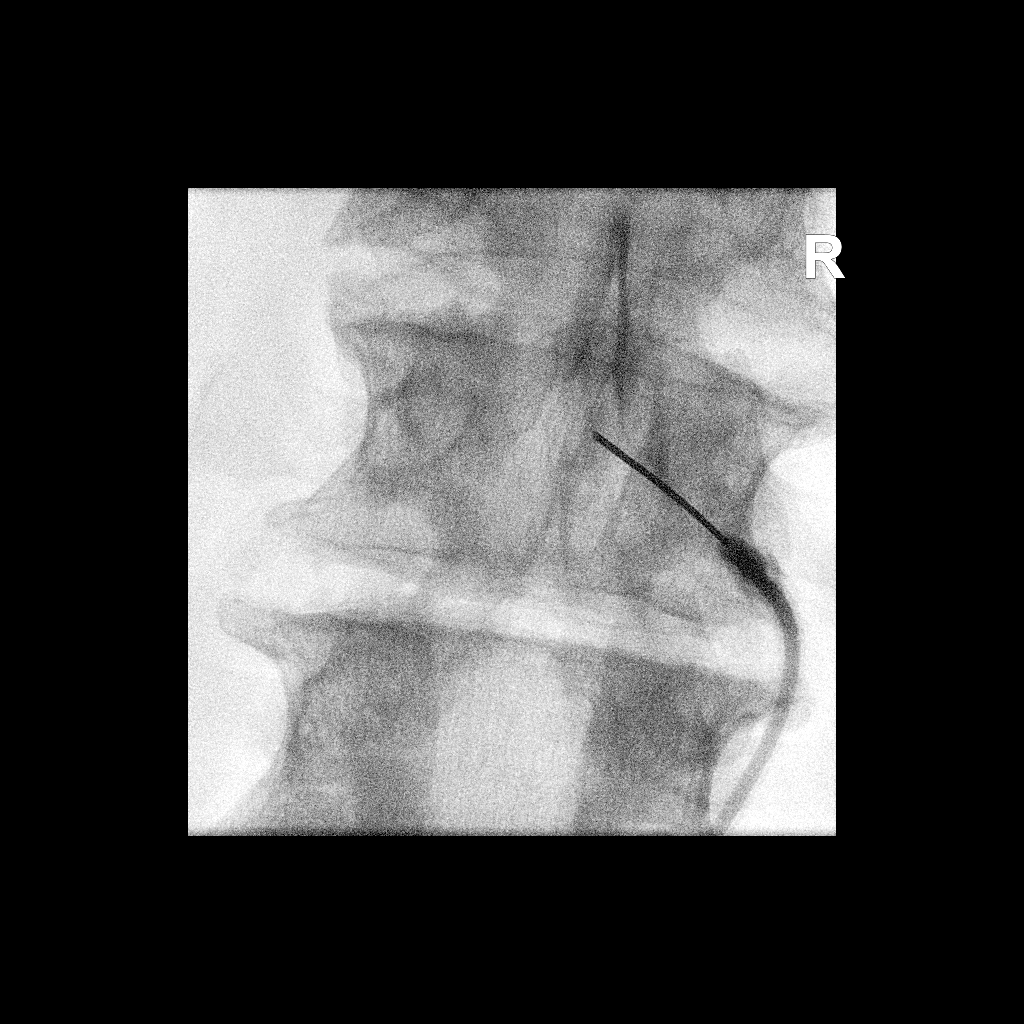

[2 of 2 positions shown; findings below may reference images not displayed]

FLUOROSCOPY:
Radiation Exposure Index (as provided by the fluoroscopic device):
2.6 mGy Kerma

PROCEDURE:
The procedure, risks, benefits, and alternatives were explained to
the patient. Questions regarding the procedure were encouraged and
answered. The patient understands and consents to the procedure.

LUMBAR EPIDURAL INJECTION:

An interlaminar approach was performed on right at L2-L3. The
overlying skin was cleansed and anesthetized. A 20 gauge epidural
needle was advanced using loss-of-resistance technique.

DIAGNOSTIC EPIDURAL INJECTION:

Injection of Isovue-M 200 shows a good epidural pattern with spread
above and below the level of needle placement, primarily on the
right no vascular opacification is seen.

THERAPEUTIC EPIDURAL INJECTION:

80 mg of Depo-Medrol mixed with 2 mL 1% lidocaine were instilled.
The procedure was well-tolerated, and the patient was discharged
thirty minutes following the injection in good condition.

COMPLICATIONS:
None.
IMPRESSION: Technically successful epidural injection on the right L2-L3.

## 2021-11-03 MED ORDER — IOPAMIDOL (ISOVUE-M 200) INJECTION 41%
1.0000 mL | Freq: Once | INTRAMUSCULAR | Status: AC
  Administered 2021-11-03: 1 mL via EPIDURAL

## 2021-11-03 MED ORDER — METHYLPREDNISOLONE ACETATE 40 MG/ML INJ SUSP (RADIOLOG
80.0000 mg | Freq: Once | INTRAMUSCULAR | Status: AC
  Administered 2021-11-03: 80 mg via EPIDURAL

## 2021-11-03 NOTE — Discharge Instructions (Signed)

## 2021-11-05 ENCOUNTER — Other Ambulatory Visit: Payer: Self-pay | Admitting: *Deleted

## 2021-11-05 DIAGNOSIS — I739 Peripheral vascular disease, unspecified: Secondary | ICD-10-CM

## 2021-11-24 ENCOUNTER — Other Ambulatory Visit: Payer: Self-pay | Admitting: Orthopedic Surgery

## 2021-11-24 DIAGNOSIS — M545 Low back pain, unspecified: Secondary | ICD-10-CM

## 2021-12-06 ENCOUNTER — Ambulatory Visit
Admission: RE | Admit: 2021-12-06 | Discharge: 2021-12-06 | Disposition: A | Discharge status: Home / Self Care | Payer: Medicare Other | Source: Ambulatory Visit | Attending: Orthopedic Surgery | Admitting: Orthopedic Surgery

## 2021-12-06 DIAGNOSIS — M545 Low back pain, unspecified: Secondary | ICD-10-CM

## 2021-12-06 IMAGING — MR MR LUMBAR SPINE W/O CM
4 of 5 series · 24 of 48 positions shown · non-contrast
Comparison: Lumbar spine MRI [DATE]. Lumbar spine radiographs
[DATE].

CLINICAL DATA: Low back pain, unspecified back pain laterality,
unspecified chronicity, unspecified whether sciatica present
additional history provided by scanning technologist: Patient
reports left leg weakness. Right leg/foot numbness.

EXAM:
MRI LUMBAR SPINE WITHOUT CONTRAST
TECHNIQUE: Multiplanar, multisequence MR imaging of the lumbar spine was
performed. No intravenous contrast was administered.

[Series 2: T2 · sagittal · 4.0mm · 0.57mm/px · 6 of 16 slices shown (1 of 2)]
[im 1/16]
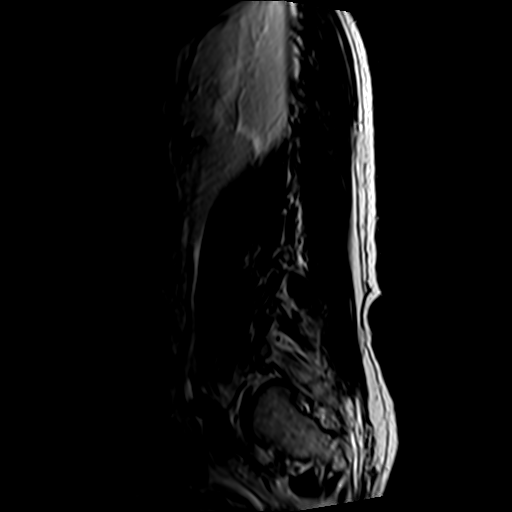
[im 4/16]
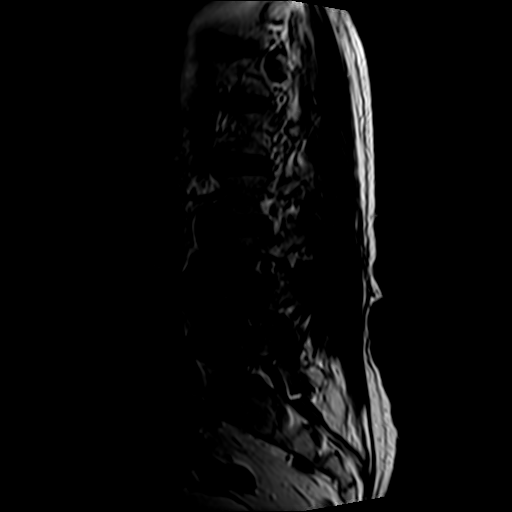
[im 7/16]
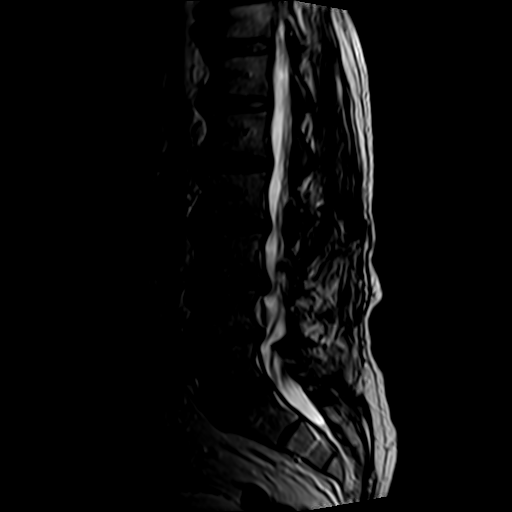
[im 10/16]
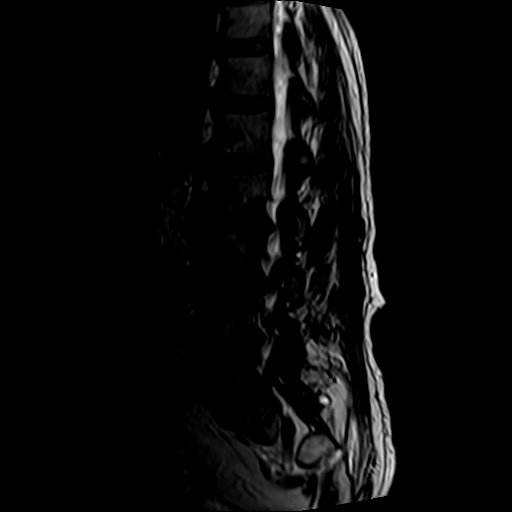
[im 13/16]
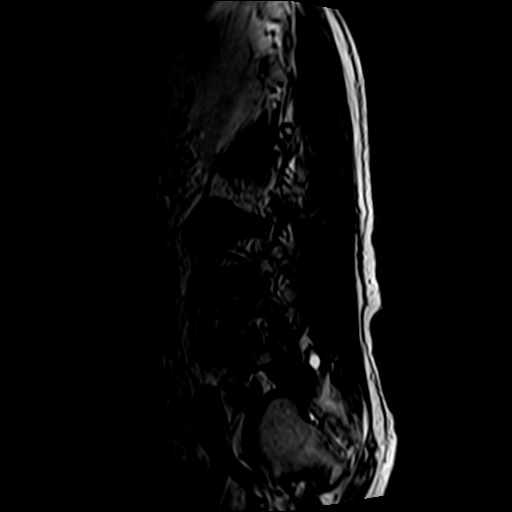
[im 16/16]
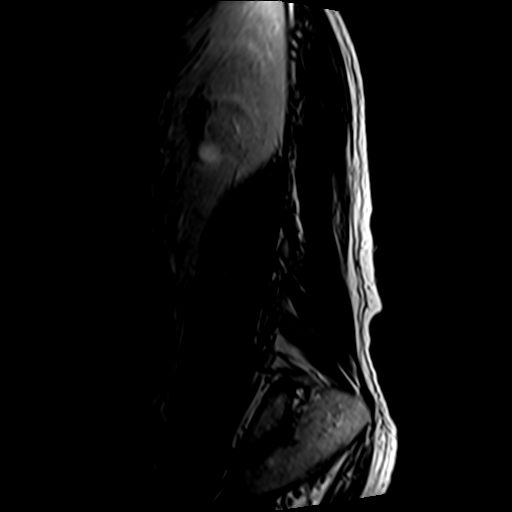

[Series 4: T1 · sagittal · 4.0mm · 0.53mm/px · 6 of 15 slices shown (1 of 2)]
[im 1/15]
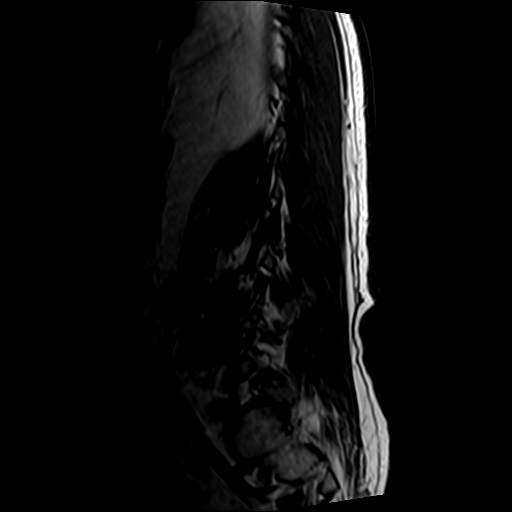
[im 3/15]
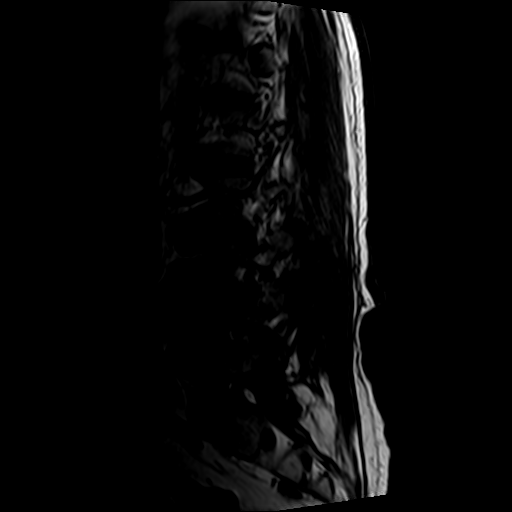
[im 6/15]
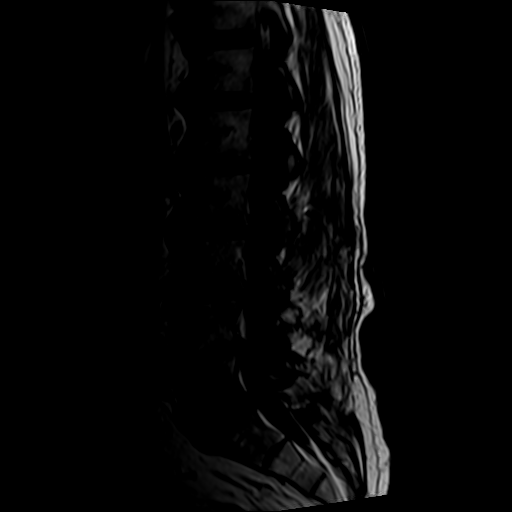
[im 9/15]
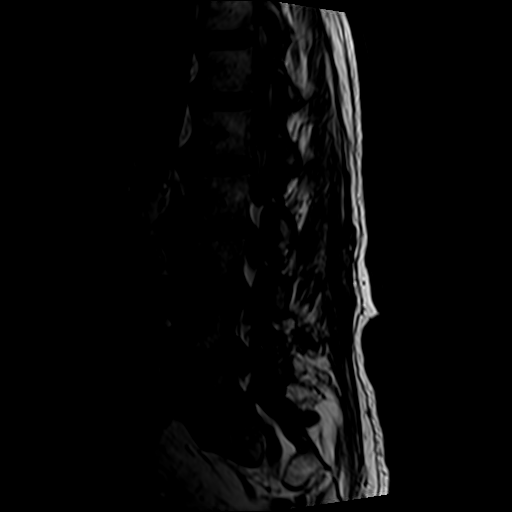
[im 12/15]
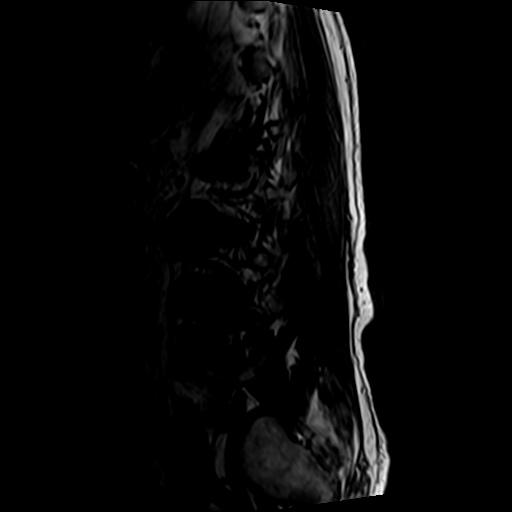
[im 15/15]
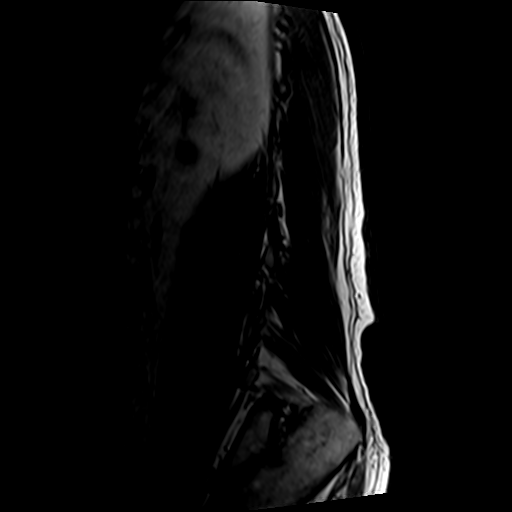

[Series 5: T1 · axial · 4.0mm · 0.35mm/px · z∈[-59,+91]mm · 3 of 40 slices shown (2 of 2)]
[im 6/40]
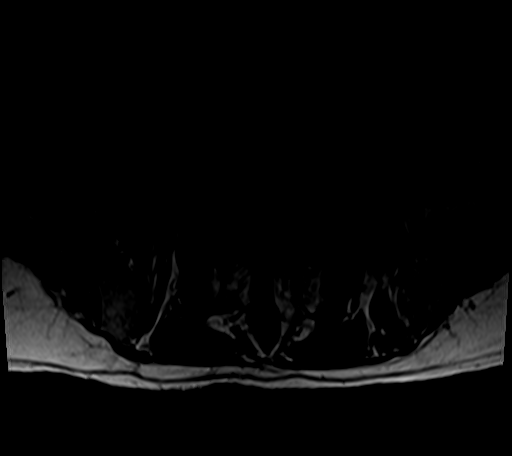
[im 20/40]
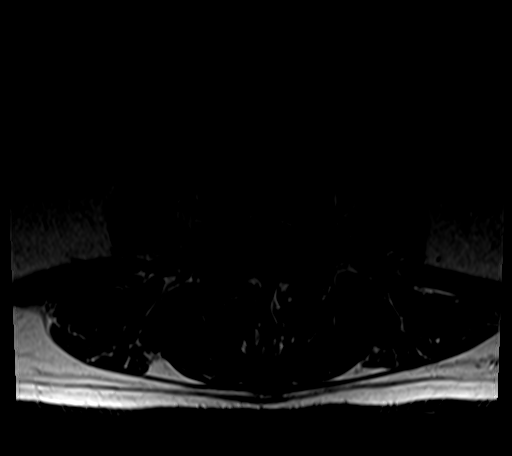
[im 34/40]
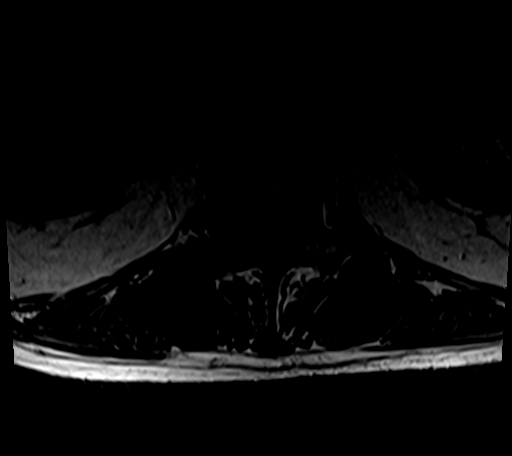

[Series 6: T2 · axial · 4.0mm · 0.78mm/px · z∈[-85,+122]mm · 9 of 40 slices shown (2 of 2)]
[im 1/40]
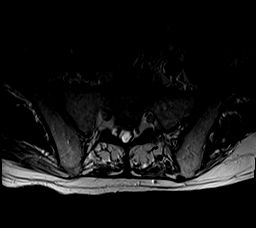
[im 6/40]
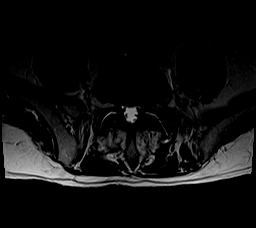
[im 12/40]
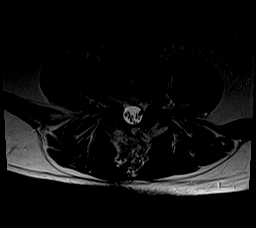
[im 17/40]
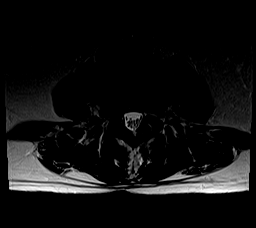
[im 20/40]
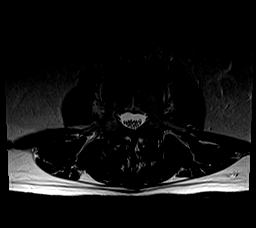
[im 23/40]
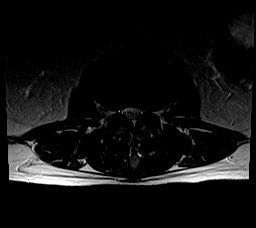
[im 28/40]
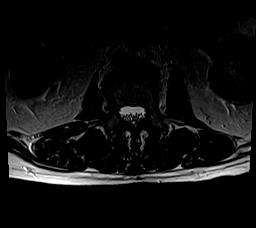
[im 34/40]
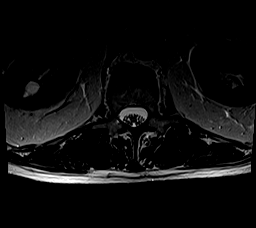
[im 40/40]
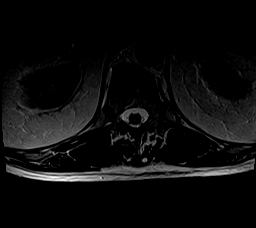

[24 of 48 positions shown; findings below may reference images not displayed]

FINDINGS: Segmentation: Lumbar vertebrae. The caudal most well-formed
intervertebral disc space is designated L5-S1.

Alignment: 3 mm L3-L4 grade 1 retrolisthesis. Trace L5-S1 grade 1
anterolisthesis.

Vertebrae: No lumbar vertebral pressure in fracture. Mild multilevel
degenerative plate irregularity with trace multilevel degenerative
endplate edema. Multilevel ventrolateral osteophytes.

Conus medullaris and cauda equina: Conus extends to the L1-L2 level.
No signal abnormality within the visualized distal spinal cord.

Paraspinal and other soft tissues: Bilateral renal cysts, some
incompletely imaged. No paraspinal mass or collection.

Disc levels:

Unless otherwise stated, the level by level findings below have not
significantly changed from the prior MRI of [DATE].

Multilevel disc degeneration, greatest at L3-L4 (moderate) at L4-L5
(advanced).

T11-T12: This level is imaged in the sagittal plane only. No
significant disc herniation or stenosis.

T12-L1: Mild facet arthrosis. No significant disc herniation or
stenosis.

L1-L2: Minimal facet arthrosis. No significant disc herniation or
stenosis.

L2-L3: Disc bulge. Mild facet arthrosis and ligamentum flavum
hypertrophy. Mild left greater than right subarticular narrowing
(without appreciable nerve root impingement). Mild central canal
narrowing. Minimal relative neural foraminal narrowing, bilaterally.

L3-L4: Redemonstrated post-laminectomy changes. 3 mm grade 1
retrolisthesis. Disc bulge. Facet arthrosis. No significant spinal
canal stenosis. Bilateral neural foraminal narrowing (moderate
right, mild-to-moderate left).

L4-L5: Redemonstrated post-laminectomy changes. Disc bulge with
endplate osteophytes. Facet arthrosis. No significant spinal canal
stenosis. Moderate bilateral neural foraminal narrowing (greater on
the right).

L5-S1: Trace grade 1 anterolisthesis. Small disc bulge. Facet
arthrosis (moderate right, moderate/advanced left) with ligamentum
flavum hypertrophy. Trace bilateral facet joint effusions. 11 mm
posteriorly projecting synovial facet cyst on the left, increased in
size from the prior MRI. Mild bilateral subarticular narrowing
(without appreciable nerve root impingement). Central canal patent.
Bilateral foraminal stenosis (mild right, moderate left).
IMPRESSION: Lumbar spondylosis and postoperative changes, as outlined. An 11 mm
left-sided posteriorly projecting synovial facet cyst at L5-S1 has
increased in size. Findings have otherwise not significantly changed
from the prior MRI of [DATE].

No more than mild central canal or subarticular narrowing.

Multilevel foraminal stenosis, as detailed and greatest bilaterally
at L4-L5 and on the left at L5-S1 (moderate at these sites). The
exiting L4 and left L5 nerve roots could be affected at these sites.

Disc degeneration is greatest at L4-L5 (advanced at this level).

Trace multilevel degenerative endplate edema.

Multilevel grade 1 spondylolisthesis, greatest at L3-L4.

## 2021-12-24 NOTE — Progress Notes (Signed)
HISTORY AND PHYSICAL     CC:  follow up. Requesting Provider:  Christain Sacramento, MD  HPI: This is a 88 y.o. male who is here today for follow up for PAD.  Pt has hx of spinal stenosis and had hx of operative repair by Dr. Lynann Bologna in March 2021.   Pt was originally seen 11/21/2020 by Dr. Oneida Alar and at that time,  pt stated he was limping on the right leg but improved.  He was having cramping sensation in the calf after walking for 15 minutes that improved with rest.  He was having some hip and thigh pain with walking as well.  He had complaints of temperature sensation changes on the medial aspect of the calf with hot and cold.  He did not feel completely debilitated by his sx but were an nuisance to home.  He was on asa and exercised regularly and on a healthy diet.   Pt was last seen March 2023 and at that time, he was able to walk for about an hour that had dropped to about 15-20 minutes before having to stop before cramping and he could then rest and walk the same distance again.  He was getting injections in his back every 4 months.  He was not having rest pain or non healing wounds.  His ABI was slightly decreased from previous visit and monphasic doppler signals.  He was encouraged to continue his walking regimen.  He was getting his ESI to see if he had any improvement in his sx.  He was going to continue his asa.  He was not taking a statin as he was drinking beet juice to help with his cholesterol.  He was scheduled for close follow up to check his symptoms.   The pt returns today for follow up.  He states his right leg has gotten some worse since I saw him last.  He states that after about 15-30 minutes of walking, the lateral aspect of the lower leg becomes painful and his foot goes numb and he has to stop due to this.  He denies any cramping in his right calf.  He states he does get some mild discomfort in the left calf with walking but it does not slow him down.  He does not have any rest  pain and denies any pain in his feet while in bed.  He does not have any non healing wounds.  He does not want to take a statin due to risk for Alzheimer's.  He does take a baby asa daily.   He tells me that he went to see Dr. Lynann Bologna since he has hx of back surgery in 2021 and they did an MRI, which looked ok.  He states that they have set him up to have an EMG of the right peroneal nerve in July and if this is the issue, there is possible intervention for this.    The pt is not on a statin for cholesterol management.    The pt is on an aspirin.    Other AC:  none The pt is on ARB for hypertension.  The pt does not have diabetes. Tobacco hx:  former  Pt does not have family hx of AAA.  Past Medical History:  Diagnosis Date   Allergic asthma 1957   "had to leave [place]"   Anxiety    Arthritis    "neck, hips" (02/17/2017)   Chronic cervical pain    Chronic hip pain    "  both hips"   Chronic lower back pain    Daily headache    "fairly constant; caused by spurs in my neck" (02/17/2017)   GERD (gastroesophageal reflux disease)    Hypertension    Prostatitis    Spinal stenosis     Past Surgical History:  Procedure Laterality Date   LUMBAR LAMINECTOMY/DECOMPRESSION MICRODISCECTOMY N/A 10/19/2019   Procedure: LUMBAR 3- LUMBAR 5 DECOMPRESSION;  Surgeon: Phylliss Bob, MD;  Location: [facility];  Service: Orthopedics;  Laterality: N/A;   MELANOMA EXCISION     "pre-melanoma taken off my back"   TONSILLECTOMY      Allergies  Allergen Reactions   Tizanidine Anaphylaxis and Other (See Comments)    Complete heart block, syncope    Zanaflex [Tizanidine Hcl] Other (See Comments)    Severe bradycardia    Gabapentin Other (See Comments)    Shaking chills At 300 mg bid   Ciprofloxacin Itching and Rash   Penicillins Itching and Rash    Has patient had a PCN reaction causing immediate rash, facial/tongue/throat swelling, SOB or lightheadedness with hypotension:yes Has patient had a PCN  reaction causing severe rash involving mucus membranes or skin necrosis:no Has patient had a PCN reaction that required hospitalization:no Has patient had a PCN reaction occurring within the last 10 years:no If all of the above answers are "NO", then may proceed with Cephalosporin use.    Sulfa Antibiotics Itching and Rash    Current Outpatient Medications  Medication Sig Dispense Refill   Glucosamine HCl (GLUCOSAMINE PO) Take 1,500 mg by mouth 2 (two) times daily.      LORazepam (ATIVAN) 0.5 MG tablet Take 0.25 mg by mouth 2 (two) times daily as needed for anxiety. Max 0.75 mg daily     naproxen (NAPROSYN) 375 MG tablet TAKE 1 TABLET BY MOUTH TWICE DAILY WITH MEALS AS NEEDED FOR ARTHRITIS PAIN     Potassium 99 MG TABS Take 99 mg by mouth daily after lunch.      tamsulosin (FLOMAX) 0.4 MG CAPS capsule Take 0.4 mg by mouth at bedtime.      telmisartan (MICARDIS) 80 MG tablet Take one tablet every morning for blood pressure control     Turmeric 500 MG CAPS Take by mouth.     vitamin C (ASCORBIC ACID) 500 MG tablet Take 500 mg by mouth 2 (two) times daily.     No current facility-administered medications for this visit.    Family History  Problem Relation Age of Onset   Hypertension Father     Social History   Socioeconomic History   Marital status: Married    Spouse name: Not on file   Number of children: 1   Years of education: 12   Highest education level: Not on file  Occupational History   Not on file  Tobacco Use   Smoking status: Former    Types: Cigars    Quit date: 1963    Years since quitting: 60.4   Smokeless tobacco: Never  Vaping Use   Vaping Use: Never used  Substance and Sexual Activity   Alcohol use: No   Drug use: No   Sexual activity: Yes  Other Topics Concern   Not on file  Social History Narrative   Lives with spouse   Caffeine use: 1 cup tea per day   Right handed    Social Determinants of Health   Financial Resource Strain: Not on file   Food Insecurity: Not on file  Transportation Needs: Not on  file  Physical Activity: Not on file  Stress: Not on file  Social Connections: Not on file  Intimate Partner Violence: Not on file     REVIEW OF SYSTEMS:   '[X]'$  denotes positive finding, '[ ]'$  denotes negative finding Cardiac  Comments:  Chest pain or chest pressure:    Shortness of breath upon exertion:    Short of breath when lying flat:    Irregular heart rhythm:        Vascular    Pain in calf, thigh, or hip brought on by ambulation: x See HPI  Pain in feet at night that wakes you up from your sleep:     Blood clot in your veins:    Leg swelling:         Pulmonary    Oxygen at home:    Productive cough:     Wheezing:         Neurologic    Sudden weakness in arms or legs:  x See HPI  Sudden numbness in arms or legs:  x See HPI  Sudden onset of difficulty speaking or slurred speech:    Temporary loss of vision in one eye:     Problems with dizziness:         Gastrointestinal    Blood in stool:     Vomited blood:         Genitourinary    Burning when urinating:     Blood in urine:        Psychiatric    Major depression:         Hematologic    Bleeding problems:    Problems with blood clotting too easily:        Skin    Rashes or ulcers:        Constitutional    Fever or chills:      PHYSICAL EXAMINATION:  Today's Vitals   01/01/22 1036 01/01/22 1044  BP: (!) 198/88   Pulse: 67   Resp: 20   Temp: (!) 97 F (36.1 C)   TempSrc: Temporal   SpO2: 97%   Weight: 155 lb (70.3 kg)   Height: '5\' 7"'$  (1.702 m)   PainSc:  10-Worst pain ever   Body mass index is 24.28 kg/m.   General:  WDWN in NAD; vital signs documented above Gait: Not observed HENT: WNL, normocephalic Pulmonary: normal non-labored breathing , without wheezing Cardiac: regular HR, without carotid bruits Abdomen: soft, NT, no masses; aortic pulse is not palpable Skin: without rashes Vascular Exam/Pulses:  Right Left   Radial 2+ (normal) 2+ (normal)  Femoral 3+ (hyperdynamic) 3+ (hyperdynamic)  Popliteal Unable to palpate Unable to palpate  AT monophasic monophasic  PT monophasic monophasic  Peroneal monophasic Brisk monophasic   Extremities: without ischemic changes, without Gangrene , without cellulitis; without open wounds Musculoskeletal: no muscle wasting or atrophy  Neurologic: A&O X 3 Psychiatric:  The pt has Normal affect.   Non-Invasive Vascular Imaging:   ABI's/TBI's on 01/01/2022: Right:  0.50/0.32 - Great toe pressure: 62 Left:  0.67/0.37 - Great toe pressure: 71   Previous ABI's/TBI's on 10/22/2021: Right:  0.65/0.43 - Great toe pressure: 82 Left:  0.70/0.41 - Great toe pressure: 78    ASSESSMENT/PLAN:: 88 y.o. male here for follow up for PAD with hx of PAD   -pt ABI on the right decreased from last visit.  He does have classic mild claudication sx on the left and this is tolerable. He has monophasic DP/PT/peroneal monophasic  doppler signals bilaterally).  His sx on the right are not classic for PAD.  He has been seen by Dr. Lynann Bologna and has been set up for a peroneal nerve EMG in July.  Discussed with pt and his wife that he could have some mixed symptoms but given his pain is not classic and he does not have rest pain or non healing wounds, would recommend following through with his testing with orthopedics and then following up with vascular MD after these studies for further recommendations.  Patient and his wife are in agreement with this plan.   Fortunately, pt is not a smoker.  -discussed with him to continue to protect his feet to prevent any non healing wounds.   -discussed with him that he would benefit from statin as his total cholesterol in care everywhere in November was 201, but has concerns about Alzhiemer's side effects.  He will discuss this with his PCP.  He will continue his asa. -he knows to call sooner if he has any issues before his next appointment.     Leontine Locket, [facility] [phone]  Clinic MD:   Scot Dock

## 2022-01-01 ENCOUNTER — Ambulatory Visit (HOSPITAL_COMMUNITY)
Admission: RE | Admit: 2022-01-01 | Discharge: 2022-01-01 | Disposition: A | Discharge status: Home / Self Care | Payer: Medicare Other | Source: Ambulatory Visit | Attending: Physician Assistant | Admitting: Physician Assistant

## 2022-01-01 ENCOUNTER — Ambulatory Visit (INDEPENDENT_AMBULATORY_CARE_PROVIDER_SITE_OTHER): Payer: Medicare Other | Admitting: Physician Assistant

## 2022-01-01 ENCOUNTER — Encounter: Payer: Self-pay | Admitting: Physician Assistant

## 2022-01-01 VITALS — BP 198/88 | HR 67 | Temp 97.0°F | Resp 20 | Ht 67.0 in | Wt 155.0 lb

## 2022-01-01 DIAGNOSIS — I739 Peripheral vascular disease, unspecified: Secondary | ICD-10-CM | POA: Insufficient documentation

## 2022-01-01 NOTE — Progress Notes (Signed)
ABI completed. Refer to "CV Proc" under chart review to view preliminary results.  01/01/2022 10:37 AM Kelby Aline., MHA, RVT, RDCS, RDMS

## 2022-01-07 ENCOUNTER — Other Ambulatory Visit: Payer: Self-pay | Admitting: *Deleted

## 2022-01-07 DIAGNOSIS — I739 Peripheral vascular disease, unspecified: Secondary | ICD-10-CM

## 2022-02-18 ENCOUNTER — Other Ambulatory Visit: Payer: Self-pay | Admitting: *Deleted

## 2022-02-18 DIAGNOSIS — I739 Peripheral vascular disease, unspecified: Secondary | ICD-10-CM

## 2022-03-03 ENCOUNTER — Other Ambulatory Visit: Payer: Self-pay | Admitting: Orthopedic Surgery

## 2022-03-03 DIAGNOSIS — M545 Low back pain, unspecified: Secondary | ICD-10-CM

## 2022-03-17 ENCOUNTER — Ambulatory Visit
Admission: RE | Admit: 2022-03-17 | Discharge: 2022-03-17 | Disposition: A | Discharge status: Home / Self Care | Payer: Medicare Other | Source: Ambulatory Visit | Attending: Orthopedic Surgery | Admitting: Orthopedic Surgery

## 2022-03-17 DIAGNOSIS — M545 Low back pain, unspecified: Secondary | ICD-10-CM

## 2022-03-17 MED ORDER — IOPAMIDOL (ISOVUE-M 200) INJECTION 41%
1.0000 mL | Freq: Once | INTRAMUSCULAR | Status: AC
  Administered 2022-03-17: 1 mL via INTRA_ARTICULAR

## 2022-03-17 MED ORDER — METHYLPREDNISOLONE ACETATE 40 MG/ML INJ SUSP (RADIOLOG
80.0000 mg | Freq: Once | INTRAMUSCULAR | Status: AC
  Administered 2022-03-17: 120 mg via INTRA_ARTICULAR

## 2022-03-17 NOTE — Discharge Instructions (Signed)

## 2022-03-19 ENCOUNTER — Encounter: Payer: Self-pay | Admitting: Vascular Surgery

## 2022-03-19 ENCOUNTER — Ambulatory Visit (HOSPITAL_COMMUNITY)
Admission: RE | Admit: 2022-03-19 | Discharge: 2022-03-19 | Disposition: A | Discharge status: Home / Self Care | Payer: Medicare Other | Source: Ambulatory Visit | Attending: Vascular Surgery | Admitting: Vascular Surgery

## 2022-03-19 ENCOUNTER — Ambulatory Visit (INDEPENDENT_AMBULATORY_CARE_PROVIDER_SITE_OTHER): Payer: Medicare Other | Admitting: Vascular Surgery

## 2022-03-19 VITALS — BP 218/101 | HR 70 | Temp 97.7°F | Resp 20 | Ht 67.0 in | Wt 155.9 lb

## 2022-03-19 DIAGNOSIS — I739 Peripheral vascular disease, unspecified: Secondary | ICD-10-CM | POA: Diagnosis not present

## 2022-03-19 DIAGNOSIS — I70219 Atherosclerosis of native arteries of extremities with intermittent claudication, unspecified extremity: Secondary | ICD-10-CM | POA: Diagnosis not present

## 2022-03-19 NOTE — Progress Notes (Signed)
REASON FOR VISIT:   Follow-up of peripheral arterial disease  MEDICAL ISSUES:   PERIPHERAL ARTERIAL DISEASE: This patient has evidence of infrainguinal arterial occlusive disease on the right with stable claudication.  He does not have rest pain or nonhealing ulcer.  Although he is 88 years old he is very active and very sharp.  We have discussed the importance of a structured walking program.  We talked about the development of collaterals and that his symptoms may improve simply with more walking.  We also discussed the option of proceeding with arteriography if he felt that his symptoms were significantly limiting his lifestyle.  His main concern is that after he is walking gets pain in his leg it is more difficult for him to drive.  He will consider this and then call if he elects to proceed with arteriography.  I have reviewed with the patient the indications for arteriography. In addition, I have reviewed the potential complications of arteriography including but not limited to: Bleeding, arterial injury, or arterial thrombosis. I have explained to the patient that if we find disease amenable to angioplasty we could potentially address this at the same time. I have discussed the potential complications of angioplasty and stenting, including but not limited to: Bleeding, arterial thrombosis, arterial injury, dissection, or the need for surgical intervention.  HPI:   Corey [name] is a pleasant 88 y.o. male who is previously been followed in our office by the physicians assistants.  He was last seen on 01/01/2022.  He does have a history of spinal stenosis and has had previous back surgery in 2021.  He has had right leg pain and he is undergone nerve conduction studies and it is felt that the pain is not from his back.  We was seen last he was encouraged to discuss beginning a statin with his primary care physician.  He was also to continue taking his aspirin.  He comes in for a follow-up  visit.  On my history the patient describes right calf and leg claudication which is brought on by ambulation and relieved with rest.  He gets symptoms after about 10 minutes.  He can walk for 30 minutes before he has to stop.  He is not having significant symptoms on the left side.  He does not get the symptoms at rest.  He denies any history of rest pain or nonhealing ulcers.  He is not a smoker.  He does take 81 mg of aspirin daily.  He has had counseling on statin therapy but has ultimately decided not to take a statin because he is concerned about the risk of Alzheimer's.  Past Medical History:  Diagnosis Date   Allergic asthma 1957   "had to leave [place]"   Anxiety    Arthritis    "neck, hips" (02/17/2017)   Chronic cervical pain    Chronic hip pain    "both hips"   Chronic lower back pain    Daily headache    "fairly constant; caused by spurs in my neck" (02/17/2017)   GERD (gastroesophageal reflux disease)    Hypertension    Prostatitis    Spinal stenosis     Family History  Problem Relation Age of Onset   Hypertension Father     SOCIAL HISTORY: Social History   Tobacco Use   Smoking status: Former    Types: Cigars    Quit date: 1963    Years since quitting: 60.6   Smokeless tobacco: Never  Substance Use Topics   Alcohol use: No    Allergies  Allergen Reactions   Tizanidine Anaphylaxis and Other (See Comments)    Complete heart block, syncope    Zanaflex [Tizanidine Hcl] Other (See Comments)    Severe bradycardia    Gabapentin Other (See Comments)    Shaking chills At 300 mg bid   Ciprofloxacin Itching and Rash   Penicillins Itching and Rash    Has patient had a PCN reaction causing immediate rash, facial/tongue/throat swelling, SOB or lightheadedness with hypotension:yes Has patient had a PCN reaction causing severe rash involving mucus membranes or skin necrosis:no Has patient had a PCN reaction that required hospitalization:no Has patient had a PCN  reaction occurring within the last 10 years:no If all of the above answers are "NO", then may proceed with Cephalosporin use.    Sulfa Antibiotics Itching and Rash    Current Outpatient Medications  Medication Sig Dispense Refill   Glucosamine HCl (GLUCOSAMINE PO) Take 1,500 mg by mouth 2 (two) times daily.      LORazepam (ATIVAN) 0.5 MG tablet Take 0.25 mg by mouth 2 (two) times daily as needed for anxiety. Max 0.75 mg daily     naproxen (NAPROSYN) 375 MG tablet TAKE 1 TABLET BY MOUTH TWICE DAILY WITH MEALS AS NEEDED FOR ARTHRITIS PAIN     Potassium 99 MG TABS Take 99 mg by mouth daily after lunch.      tamsulosin (FLOMAX) 0.4 MG CAPS capsule Take 0.4 mg by mouth at bedtime.      telmisartan (MICARDIS) 80 MG tablet Take one tablet every morning for blood pressure control     Turmeric 500 MG CAPS Take by mouth.     vitamin C (ASCORBIC ACID) 500 MG tablet Take 500 mg by mouth 2 (two) times daily.     No current facility-administered medications for this visit.    REVIEW OF SYSTEMS:  '[X]'$  denotes positive finding, '[ ]'$  denotes negative finding Cardiac  Comments:  Chest pain or chest pressure:    Shortness of breath upon exertion:    Short of breath when lying flat:    Irregular heart rhythm:        Vascular    Pain in calf, thigh, or hip brought on by ambulation: x   Pain in feet at night that wakes you up from your sleep:     Blood clot in your veins:    Leg swelling:         Pulmonary    Oxygen at home:    Productive cough:     Wheezing:         Neurologic    Sudden weakness in arms or legs:     Sudden numbness in arms or legs:     Sudden onset of difficulty speaking or slurred speech:    Temporary loss of vision in one eye:     Problems with dizziness:         Gastrointestinal    Blood in stool:     Vomited blood:         Genitourinary    Burning when urinating:     Blood in urine:        Psychiatric    Major depression:         Hematologic    Bleeding  problems:    Problems with blood clotting too easily:        Skin    Rashes or ulcers:  Constitutional    Fever or chills:     PHYSICAL EXAM:   Vitals:   03/19/22 0933  BP: (!) 218/101  Pulse: 70  Resp: 20  Temp: 97.7 F (36.5 C)  SpO2: 96%  Weight: 155 lb 14.4 oz (70.7 kg)  Height: '5\' 7"'$  (1.702 m)    GENERAL: The patient is a well-nourished male, in no acute distress. The vital signs are documented above. CARDIAC: There is a regular rate and rhythm.  VASCULAR: I do not detect carotid bruits. On the right side, which is the symptomatic side, he has a palpable femoral pulse.  I cannot palpate a popliteal or pedal pulses. On the left side he has a palpable femoral pulse and popliteal pulse.  I cannot palpate pedal pulses. He has no significant lower extremity swelling. PULMONARY: There is good air exchange bilaterally without wheezing or rales. ABDOMEN: Soft and non-tender with normal pitched bowel sounds.  I do not palpate any aneurysm. MUSCULOSKELETAL: There are no major deformities or cyanosis. NEUROLOGIC: No focal weakness or paresthesias are detected. SKIN: There are no ulcers or rashes noted. PSYCHIATRIC: The patient has a normal affect.  DATA:    ARTERIAL DOPPLER STUDY: I have independently interpreted the patient's arterial Doppler study today.  On the right side there is a monophasic dorsalis pedis and posterior tibial signal.  ABIs 53%.  Toe pressure 65 mmHg.  On the left side is a monophasic dorsalis pedis and posterior tibial signal.  ABIs 68%.  Toe pressure 75 mmHg.  A total of 40 minutes was spent on this visit. 20 minutes was face to face time. More than 50% of the time was spent on counseling and coordinating with the patient.    Deitra Mayo [facility] [phone]

## 2022-09-14 ENCOUNTER — Other Ambulatory Visit: Payer: Self-pay | Admitting: Orthopedic Surgery

## 2022-09-14 ENCOUNTER — Encounter: Payer: Self-pay | Admitting: Orthopedic Surgery

## 2022-09-14 DIAGNOSIS — G8929 Other chronic pain: Secondary | ICD-10-CM

## 2022-09-22 ENCOUNTER — Other Ambulatory Visit: Payer: Self-pay | Admitting: Orthopedic Surgery

## 2022-09-22 ENCOUNTER — Ambulatory Visit
Admission: RE | Admit: 2022-09-22 | Discharge: 2022-09-22 | Disposition: A | Discharge status: Home / Self Care | Payer: Medicare Other | Source: Ambulatory Visit | Attending: Orthopedic Surgery | Admitting: Orthopedic Surgery

## 2022-09-22 DIAGNOSIS — G8929 Other chronic pain: Secondary | ICD-10-CM

## 2022-09-22 DIAGNOSIS — M5442 Lumbago with sciatica, left side: Secondary | ICD-10-CM

## 2022-09-22 MED ORDER — IOPAMIDOL (ISOVUE-M 200) INJECTION 41%
1.0000 mL | Freq: Once | INTRAMUSCULAR | Status: AC
  Administered 2022-09-22: 1 mL via INTRA_ARTICULAR

## 2022-09-22 MED ORDER — METHYLPREDNISOLONE ACETATE 40 MG/ML INJ SUSP (RADIOLOG
80.0000 mg | Freq: Once | INTRAMUSCULAR | Status: AC
  Administered 2022-09-22: 80 mg via INTRA_ARTICULAR

## 2022-09-22 NOTE — Discharge Instructions (Signed)

## 2023-08-10 ENCOUNTER — Emergency Department (HOSPITAL_COMMUNITY): Payer: Medicare Other

## 2023-08-10 ENCOUNTER — Other Ambulatory Visit: Payer: Self-pay

## 2023-08-10 ENCOUNTER — Encounter (HOSPITAL_COMMUNITY): Payer: Self-pay | Admitting: Internal Medicine

## 2023-08-10 ENCOUNTER — Observation Stay (HOSPITAL_COMMUNITY)
Admission: EM | Admit: 2023-08-10 | Discharge: 2023-08-11 | Disposition: A | Discharge status: Home / Self Care | Payer: Medicare Other | Attending: Emergency Medicine | Admitting: Emergency Medicine

## 2023-08-10 DIAGNOSIS — N401 Enlarged prostate with lower urinary tract symptoms: Secondary | ICD-10-CM | POA: Insufficient documentation

## 2023-08-10 DIAGNOSIS — Z7982 Long term (current) use of aspirin: Secondary | ICD-10-CM | POA: Insufficient documentation

## 2023-08-10 DIAGNOSIS — R55 Syncope and collapse: Principal | ICD-10-CM | POA: Insufficient documentation

## 2023-08-10 DIAGNOSIS — I10 Essential (primary) hypertension: Secondary | ICD-10-CM | POA: Diagnosis not present

## 2023-08-10 DIAGNOSIS — F419 Anxiety disorder, unspecified: Secondary | ICD-10-CM | POA: Diagnosis not present

## 2023-08-10 DIAGNOSIS — Z85828 Personal history of other malignant neoplasm of skin: Secondary | ICD-10-CM | POA: Insufficient documentation

## 2023-08-10 DIAGNOSIS — Z79899 Other long term (current) drug therapy: Secondary | ICD-10-CM | POA: Diagnosis not present

## 2023-08-10 DIAGNOSIS — J45909 Unspecified asthma, uncomplicated: Secondary | ICD-10-CM | POA: Diagnosis not present

## 2023-08-10 DIAGNOSIS — R319 Hematuria, unspecified: Secondary | ICD-10-CM | POA: Insufficient documentation

## 2023-08-10 DIAGNOSIS — G4700 Insomnia, unspecified: Secondary | ICD-10-CM | POA: Insufficient documentation

## 2023-08-10 LAB — COMPREHENSIVE METABOLIC PANEL
ALT: 13 U/L (ref 0–44)
AST: 16 U/L (ref 15–41)
Albumin: 3.2 g/dL — ABNORMAL LOW (ref 3.5–5.0)
Alkaline Phosphatase: 45 U/L (ref 38–126)
Anion gap: 9 (ref 5–15)
BUN: 17 mg/dL (ref 8–23)
CO2: 24 mmol/L (ref 22–32)
Calcium: 7.7 mg/dL — ABNORMAL LOW (ref 8.9–10.3)
Chloride: 106 mmol/L (ref 98–111)
Creatinine, Ser: 0.94 mg/dL (ref 0.61–1.24)
GFR, Estimated: >60 mL/min (ref >60)
Glucose, Bld: 108 mg/dL — ABNORMAL HIGH (ref 70–99)
Potassium: 3.5 mmol/L (ref 3.5–5.1)
Sodium: 139 mmol/L (ref 135–145)
Total Bilirubin: 1.1 mg/dL (ref 0.0–1.2)
Total Protein: 5.1 g/dL — ABNORMAL LOW (ref 6.5–8.1)

## 2023-08-10 LAB — CBC WITH DIFFERENTIAL/PLATELET
Abs Immature Granulocytes: 0.06 10*3/uL (ref 0.00–0.07)
Basophils Absolute: 0 10*3/uL (ref 0.0–0.1)
Basophils Relative: 0 %
Eosinophils Absolute: 0.1 10*3/uL (ref 0.0–0.5)
Eosinophils Relative: 1 %
HCT: 37.7 % — ABNORMAL LOW (ref 39.0–52.0)
Hemoglobin: 13 g/dL (ref 13.0–17.0)
Immature Granulocytes: 1 %
Lymphocytes Relative: 9 %
Lymphs Abs: 1.1 10*3/uL (ref 0.7–4.0)
MCH: 29.7 pg (ref 26.0–34.0)
MCHC: 34.5 g/dL (ref 30.0–36.0)
MCV: 86.1 fL (ref 80.0–100.0)
Monocytes Absolute: 0.8 10*3/uL (ref 0.1–1.0)
Monocytes Relative: 7 %
Neutro Abs: 10.3 10*3/uL — ABNORMAL HIGH (ref 1.7–7.7)
Neutrophils Relative %: 82 %
Platelets: 161 10*3/uL (ref 150–400)
RBC: 4.38 MIL/uL (ref 4.22–5.81)
RDW: 12.8 % (ref 11.5–15.5)
WBC: 12.4 10*3/uL — ABNORMAL HIGH (ref 4.0–10.5)
nRBC: 0 % (ref 0.0–0.2)

## 2023-08-10 LAB — CBG MONITORING, ED: Glucose-Capillary: 117 mg/dL — ABNORMAL HIGH (ref 70–99)

## 2023-08-10 LAB — TROPONIN I (HIGH SENSITIVITY)
Troponin I (High Sensitivity): 4 ng/L (ref <18)
Troponin I (High Sensitivity): 6 ng/L (ref <18)

## 2023-08-10 LAB — MAGNESIUM: Magnesium: 1.9 mg/dL (ref 1.7–2.4)

## 2023-08-10 MED ORDER — IRBESARTAN 300 MG PO TABS
300.0000 mg | ORAL_TABLET | Freq: Every day | ORAL | Status: DC
  Administered 2023-08-10 – 2023-08-11 (×2): 300 mg via ORAL
  Filled 2023-08-10 (×2): qty 1

## 2023-08-10 MED ORDER — ASPIRIN 81 MG PO TBEC
81.0000 mg | DELAYED_RELEASE_TABLET | Freq: Every day | ORAL | Status: DC
  Administered 2023-08-10 – 2023-08-11 (×2): 81 mg via ORAL
  Filled 2023-08-10 (×2): qty 1

## 2023-08-10 MED ORDER — RIVAROXABAN 10 MG PO TABS
10.0000 mg | ORAL_TABLET | Freq: Every day | ORAL | Status: DC
  Administered 2023-08-10 – 2023-08-11 (×2): 10 mg via ORAL
  Filled 2023-08-10 (×2): qty 1

## 2023-08-10 MED ORDER — LORAZEPAM 0.5 MG PO TABS
0.2500 mg | ORAL_TABLET | Freq: Every evening | ORAL | Status: DC | PRN
  Administered 2023-08-10: 0.25 mg via ORAL
  Filled 2023-08-10: qty 1

## 2023-08-10 MED ORDER — LORAZEPAM 0.5 MG PO TABS: 0.2500 mg | ORAL_TABLET | Freq: Every evening | ORAL | Status: DC | PRN

## 2023-08-10 MED ORDER — ACETAMINOPHEN 325 MG PO TABS: 650.0000 mg | ORAL_TABLET | Freq: Four times a day (QID) | ORAL | Status: DC | PRN

## 2023-08-10 MED ORDER — ONDANSETRON HCL 4 MG/2ML IJ SOLN
4.0000 mg | Freq: Once | INTRAMUSCULAR | Status: AC
  Administered 2023-08-10: 4 mg via INTRAVENOUS
  Filled 2023-08-10: qty 2

## 2023-08-10 MED ORDER — KETOROLAC TROMETHAMINE 15 MG/ML IJ SOLN: 15.0000 mg | Freq: Four times a day (QID) | INTRAMUSCULAR | Status: DC | PRN

## 2023-08-10 MED ORDER — ACETAMINOPHEN 650 MG RE SUPP: 650.0000 mg | Freq: Four times a day (QID) | RECTAL | Status: DC | PRN

## 2023-08-10 MED ORDER — ONDANSETRON HCL 4 MG PO TABS: 4.0000 mg | ORAL_TABLET | Freq: Four times a day (QID) | ORAL | Status: DC | PRN

## 2023-08-10 MED ORDER — TAMSULOSIN HCL 0.4 MG PO CAPS
0.4000 mg | ORAL_CAPSULE | Freq: Every day | ORAL | Status: DC
Start: 2023-08-10 — End: 2023-08-11
  Administered 2023-08-10: 0.4 mg via ORAL
  Filled 2023-08-10: qty 1

## 2023-08-10 NOTE — ED Provider Notes (Signed)
 Emergency Medicine Provider Triage Evaluation Note  Corey [name] , a 89 y.o. male  was evaluated in triage.  Pt complains of nausea and syncope. Reportedly cbg 132, incontinent. Wife en route.   Review of Systems  Positive: Abrasion, chronic back pain Negative: Chest pain, dyspnea, bony pain  Physical Exam  SpO2 96%  Gen:   Awake, no distress   Resp:  Normal effort  MSK:   Moves extremities without difficulty  Other:  Abrasion to forehead, no bony ttp, no abdominal pain.   Medical Decision Making  Medically screening exam initiated at 6:10 AM.  Appropriate orders placed.  Corey [name] was informed that the remainder of the evaluation will be completed by another provider, this initial triage assessment does not replace that evaluation, and the importance of remaining in the ED until their evaluation is complete.     Lorette Mayo, MD 08/10/23 [phone]

## 2023-08-10 NOTE — ED Provider Notes (Signed)
 [place] EMERGENCY DEPARTMENT AT [facility] Provider Note   CSN: [id number] Arrival date & time: 08/10/23  0539     History  Chief Complaint  Patient presents with   Near Syncope    Corey [name] is a 89 y.o. male past medical history significant for complete heart block due to Zanaflex presents today after a syncopal episode.  Patient recalls getting up to use the bathroom and not feeling well and that he might throw up but does not remember the syncopal episode.  Patient's wife was nearby and heard him go down.  She denies any seizure-like activity.  Patient and wife endorse incontinence of both bowel and bladder.  Patient was diaphoretic during the episode.  Patient denies tongue bite, weakness, numbness, confusion, or any pain at this time.  Patient does state that he has had previous episodes where when he has had to throw up in the past he is also had syncopal episodes similar to this episode.  Patient endorses nausea at this time.  Patient and patient's wife note abrasion above left eyebrow and to left elbow.  Patient not on blood thinner.   Near Syncope       Home Medications Prior to Admission medications   Medication Sig Start Date End Date Taking? Authorizing Provider  aspirin  EC 81 MG tablet Take 81 mg by mouth daily. Swallow whole.   Yes [provider]  Glucosamine HCl (GLUCOSAMINE PO) Take 1,500 mg by mouth 2 (two) times daily.    Yes [provider]  LORazepam  (ATIVAN ) 0.5 MG tablet Take 0.25 mg by mouth 2 (two) times daily as needed for anxiety. Max 0.75 mg daily   Yes [provider]  naproxen (NAPROSYN) 375 MG tablet Take 375 mg by mouth 2 (two) times daily as needed for mild pain (pain score 1-3) or moderate pain (pain score 4-6). 06/05/20  Yes [provider]  Potassium 99 MG TABS Take 99 mg by mouth daily after lunch.    Yes [provider]  tamsulosin  (FLOMAX ) 0.4 MG CAPS capsule Take 0.4 mg by  mouth at bedtime.  07/30/15  Yes [provider]  telmisartan (MICARDIS) 80 MG tablet Take 80 mg by mouth daily. 06/05/20  Yes [provider]  Turmeric 500 MG CAPS Take 500 mg by mouth daily.   Yes [provider]  vitamin C (ASCORBIC ACID) 500 MG tablet Take 500 mg by mouth 2 (two) times daily.   Yes [provider]      Allergies    Tizanidine, Zanaflex [tizanidine hcl], Gabapentin , Ciprofloxacin, Penicillins, and Sulfa antibiotics    Review of Systems   Review of Systems  Cardiovascular:  Positive for near-syncope.  Gastrointestinal:  Positive for nausea.  Neurological:  Positive for syncope.    Physical Exam Updated Vital Signs BP 131/64   Pulse 76   Temp 98.3 F (36.8 C) (Oral)   Resp (!) 9   Ht 5' 7 (1.702 m)   Wt 71.7 kg   SpO2 94%   BMI 24.75 kg/m  Physical Exam Vitals and nursing note reviewed.  Constitutional:      General: He is not in acute distress.    Appearance: Normal appearance. He is well-developed. He is not ill-appearing or diaphoretic.  HENT:     Head: Normocephalic and atraumatic. No raccoon eyes or Battle's sign.     Jaw: There is normal jaw occlusion.     Comments: Small abrasion to patient's forehead just above  left eyebrow.  Hemostatic on exam.    Right Ear: External ear normal.     Left Ear: External ear normal.     Nose: Nose normal.     Mouth/Throat:     Mouth: Mucous membranes are moist.  Eyes:     Extraocular Movements: Extraocular movements intact.     Conjunctiva/sclera: Conjunctivae normal.     Pupils: Pupils are equal, round, and reactive to light.  Cardiovascular:     Rate and Rhythm: Normal rate and regular rhythm.     Pulses: Normal pulses.     Heart sounds: Normal heart sounds. No murmur heard. Pulmonary:     Effort: Pulmonary effort is normal. No respiratory distress.     Breath sounds: Normal breath sounds.  Abdominal:     Palpations: Abdomen is soft.     Tenderness: There is no  abdominal tenderness.  Musculoskeletal:        General: No swelling or tenderness. Normal range of motion.     Cervical back: Normal range of motion and neck supple. No rigidity or tenderness.     Comments: Patient has no bony tenderness along spine, pelvis, collarbones, shoulders, upper and lower extremities.  Patient has equal grip strength.  Able to plantar and dorsiflex feet against resistance.  Able to straight leg raise bilateral legs.  Skin:    General: Skin is warm and dry.     Capillary Refill: Capillary refill takes less than 2 seconds.     Findings: No bruising.     Comments: Small abrasion to patient's radial side of his left elbow that is hemostatic on exam.  Neurological:     General: No focal deficit present.     Mental Status: He is alert.     Cranial Nerves: No cranial nerve deficit.     Motor: No weakness.  Psychiatric:        Mood and Affect: Mood normal.     ED Results / Procedures / Treatments   Labs (all labs ordered are listed, but only abnormal results are displayed) Labs Reviewed  CBC WITH DIFFERENTIAL/PLATELET - Abnormal; Notable for the following components:      Result Value   WBC 12.4 (*)    HCT 37.7 (*)    Neutro Abs 10.3 (*)    All other components within normal limits  COMPREHENSIVE METABOLIC PANEL - Abnormal; Notable for the following components:   Glucose, Bld 108 (*)    Calcium 7.7 (*)    Total Protein 5.1 (*)    Albumin  3.2 (*)    All other components within normal limits  CBG MONITORING, ED - Abnormal; Notable for the following components:   Glucose-Capillary 117 (*)    All other components within normal limits  MAGNESIUM  TROPONIN I (HIGH SENSITIVITY)  TROPONIN I (HIGH SENSITIVITY)    EKG EKG Interpretation Date/Time:  Tuesday August 10 2023 08:18:17 EST Ventricular Rate:  75 PR Interval:  217 QRS Duration:  143 QT Interval:  416 QTC Calculation: 465 R Axis:   49  Text Interpretation: Sinus rhythm Borderline prolonged PR  interval Right bundle branch block Confirmed by Melvenia Motto ([id number]) on 08/10/2023 10:20:19 AM  Radiology CT HEAD WO CONTRAST ( ) Result Date: 08/10/2023 CLINICAL DATA:  Head trauma, minor. Syncopal episode. For at laceration. EXAM: CT HEAD WITHOUT CONTRAST TECHNIQUE: Contiguous axial images were obtained from the base of the skull through the vertex without intravenous contrast. RADIATION DOSE REDUCTION: This exam was performed according to the departmental dose-optimization  program which includes automated exposure control, adjustment of the mA and/or kV according to patient size and/or use of iterative reconstruction technique. COMPARISON:  MRI 03/13/2018 FINDINGS: Brain: Generalized age related volume loss. Chronic small-vessel ischemic changes of the cerebral hemispheric white matter. 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 No sign of acute infarction, mass lesion, hemorrhage, hydrocephalus or extra-axial collection. Vascular: There is atherosclerotic calcification of the major vessels at the base of the brain. Skull: No skull fracture. Sinuses/Orbits: Clear/normal Other: Mild soft tissue swelling of the forehead. IMPRESSION: 1. No acute intracranial finding. Mild soft tissue swelling of the forehead. 2. Chronic small-vessel ischemic changes of the cerebral hemispheric white matter. 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 Electronically Signed   By: Oneil Officer M.D.   On: 08/10/2023 07:47   DG Chest Portable 1 View Result Date: 08/10/2023 CLINICAL DATA:  89 year old male with history of syncope. EXAM: PORTABLE CHEST 1 VIEW COMPARISON:  Chest x-ray 02/17/2017. FINDINGS: Lung volumes are normal. No consolidative airspace disease. No pleural effusions. No pneumothorax. No pulmonary nodule or mass noted. Pulmonary vasculature and the cardiomediastinal silhouette are within normal limits. Atherosclerosis in the thoracic aorta. IMPRESSION: 1.  No radiographic evidence of acute  cardiopulmonary disease. 2. Aortic atherosclerosis. Electronically Signed   By: Toribio Aye M.D.   On: 08/10/2023 06:33    Procedures Procedures    Medications Ordered in ED Medications  rivaroxaban  (XARELTO ) tablet 10 mg (has no administration in time range)  acetaminophen  (TYLENOL ) tablet 650 mg (has no administration in time range)    Or  acetaminophen  (TYLENOL ) suppository 650 mg (has no administration in time range)  ketorolac  (TORADOL ) 15 MG/ML injection 15 mg (has no administration in time range)  ondansetron  (ZOFRAN ) injection 4 mg (4 mg Intravenous Given 08/10/23 9342)    ED Course/ Medical Decision Making/ A&P                                 Medical Decision Making Risk Prescription drug management.   This patient presents to the ED with chief complaint(s) of syncopal episode with pertinent past medical history of heart block which further complicates the presenting complaint. The complaint involves an extensive differential diagnosis and also carries with it a high risk of complications and morbidity.    The differential diagnosis includes arrhythmia, electrolyte abnormality, seizure, vasovagal syncope, brain bleed, orthostatic hypotension, ACS, hypoglycemia  Additional history obtained: Additional history obtained from family Records reviewed cardiology progress notes  ED Course and Reassessment:   Independent labs interpretation:  The following labs were independently interpreted:  EKG: Sinus, RBBB, borderline prolonged PR CBC: Mild leukocytosis at 12.4 CMP: Hypocalcemia 7.7, mildly decreased albumin  at 3.2, decreased total protein at 5.1 Magnesium: 1.9 Troponin: 4, 6 CBG: 117  Independent visualization of imaging: - I independently visualized the following imaging with scope of interpretation limited to determining acute life threatening conditions related to emergency care:  Chest x-ray: No evidence of acute cardiopulmonary disease CT head Noncon:  No acute intracranial findings, mild soft tissue swelling of the forehead  Consultation: - Consulted or discussed management/test interpretation w/ external professional: Hospitalist, Dr. Eben who is agreeable to admission for observation after syncope  Consideration for admission or further workup: Patient admitted for observation after syncopal episode.        Final Clinical Impression(s) / ED Diagnoses Final diagnoses:  None    Rx / DC Orders ED Discharge  Orders     None         Francis Ileana SAILOR, PA-C 08/10/23 1026    Melvenia Motto, MD 08/10/23 [phone]

## 2023-08-10 NOTE — H&P (Addendum)
 Date: 08/10/2023               Patient Name:  Corey [name] MRN: [id number]  DOB: 01-23-1932 Age / Sex: 89 y.o., male   PCP: Tanda Prentice DEL, MD         Medical Service: Internal Medicine Teaching Service         Attending Physician: Dr. Eben Reyes BROCKS, MD    First Contact: Dr. Norman Lobstein Pager: [phone]  Second Contact: Dr. Morene Bathe Pager: [phone]       After Hours (After 5p/  First Contact Pager: [phone]  weekends / holidays): Second Contact Pager: [phone]   Chief Complaint: syncope  History of Present Illness:   Corey [name] is a 89 y.o. M with PMH of anxiety, hip and spine arthritis, peripheral artery disease, prior complete heart block, HTN, BPPH who presented after an unwitnessed syncopal event at home admitted for syncope work up.  He explains that he normally gets up twice in the night to use the restroom. He isn't sure if this was his first or second wake up of the night, but remembers feeling nauseous and when he got up he passed out. He does not remember the event and does not remember if he made it to the bathroom. He does endorse some vomiting and dry heaving. Aside from this bout of nausea he has felt generally well. He has not had increased weakness, dizziness, palpitations, difficulty breathing, diarrhea. He has been eating and drinking well. He notes several episodes over many years of passing out when feeling suddenly nauseous and does not believe he remembered the episodes when they happened. He does believe he took a lorazepam  prior to going to bed last night. He did urinate on himself and vomit during/after the event but did not have seizure like activity.  Of note he does have baseline unsteady gate due to arthritis and peripheral artery disease but does not use ambulatory assistive devices. He typically walks 20 minutes twice per day.  Past Medical History: Past Medical History:  Diagnosis Date   Allergic asthma 1957   had to leave  [place]   Anxiety    Arthritis    neck, hips (02/17/2017)   Chronic cervical pain    Chronic hip pain    both hips   Chronic lower back pain    Daily headache    fairly constant; caused by spurs in my neck (02/17/2017)   GERD (gastroesophageal reflux disease)    Hypertension    Prostatitis    Spinal stenosis    Past Surgical History: Past Surgical History:  Procedure Laterality Date   LUMBAR LAMINECTOMY/DECOMPRESSION MICRODISCECTOMY N/A 10/19/2019   Procedure: LUMBAR 3- LUMBAR 5 DECOMPRESSION;  Surgeon: Beuford Anes, MD;  Location: [facility] OR;  Service: Orthopedics;  Laterality: N/A;   MELANOMA EXCISION     pre-melanoma taken off my back   TONSILLECTOMY     Meds:  Current Meds  Medication Sig   aspirin  EC 81 MG tablet Take 81 mg by mouth daily. Swallow whole.   Glucosamine HCl (GLUCOSAMINE PO) Take 1,500 mg by mouth 2 (two) times daily.    LORazepam  (ATIVAN ) 0.5 MG tablet Take 0.25 mg by mouth 2 (two) times daily as needed for anxiety. Max 0.75 mg daily   naproxen (NAPROSYN) 375 MG tablet Take 375 mg by mouth 2 (two) times daily as needed for mild pain (pain score 1-3) or moderate pain (pain score 4-6).   Potassium 99  MG TABS Take 99 mg by mouth daily after lunch.    tamsulosin  (FLOMAX ) 0.4 MG CAPS capsule Take 0.4 mg by mouth at bedtime.    telmisartan (MICARDIS) 80 MG tablet Take 80 mg by mouth daily.   Turmeric 500 MG CAPS Take 500 mg by mouth daily.   vitamin C (ASCORBIC ACID) 500 MG tablet Take 500 mg by mouth 2 (two) times daily.   Allergies: Allergies as of 08/10/2023 - Review Complete 08/10/2023  Allergen Reaction Noted   Tizanidine Anaphylaxis and Other (See Comments) 02/19/2017   Zanaflex [tizanidine hcl] Other (See Comments) 02/17/2017   Gabapentin  Other (See Comments) 03/21/2018   Ciprofloxacin Itching and Rash 11/03/2015   Penicillins Itching and Rash 11/03/2015   Sulfa antibiotics Itching and Rash 11/03/2015   Family History:  Family History  Problem  Relation Age of Onset   Hypertension Mother    Stroke Mother    Hypertension Father    Cancer Father    Social History: Lives with his wife, Niels [phone]). He is independent in ADLs and IADLs. He denies alcohol, tobacco, or other substance use. His PCP is through [facility].  Review of Systems: A complete ROS was negative except as per HPI.   Physical Exam: Blood pressure 131/64, pulse 76, temperature 98.3 F (36.8 C), temperature source Oral, resp. rate (!) 9, height 5' 7 (1.702 m), weight 71.7 kg, SpO2 94%. Constitutional:Pleasant elderly gentleman resting comfortably. In no acute distress. Cardio:Regular rate and rhythm. No murmurs, rubs, or gallops. Pulm:Clear to auscultation bilaterally. Normal work of breathing on room air. Abdomen:Soft, nontender, nondistended. FDX:Wzhjupcz for extremity edema. Skin:Warm and dry. Neuro:Alert and oriented x3. Bilateral UE and LE strength 5/5. Sensation normal and equal bilateral UE and LE.  Psych:Pleasant mood and affect.  EKG: personally reviewed my interpretation is NSR with RBBB, borderline prolonged PR interval.  CXR: 1.  No radiographic evidence of acute cardiopulmonary disease. 2. Aortic atherosclerosis.  CT Head: 1. No acute intracranial finding. Mild soft tissue swelling of the forehead. 2. Chronic small-vessel ischemic changes of the cerebral hemispheric white matter. 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  Assessment & Plan by Problem: Principal Problem:   Syncope Syncope Overnight syncope when getting out of bed to use the restroom due to feeling suddenly nauseous. Differential includes vasovagal syncope, orthostatic hypotension, arrhythmia-induced syncope, acute neurological event though more likely related to vasovagal response given occurrence with nausea and previous episodes of the same. EKG stable from prior with NSR, RBBB. Lab results overall unremarkable thus far including troponin level. He had been  eating and drinking well prior to this episode and does not appear volume deplete. CBG was normal on arrival. CT head without evidence of acute CVA and based on my exam I do not suspect this.  Plan: -Orthostatic VS -Telemetry monitoring -Zofran  4 mg q6h PRN nausea, vomiting  HTN BP elevated in ED. He has not taken his BP medications this morning. Plan: -Continue formulary equivalent of home telmisartan 80 mg daily  BPPH Plan: -Continue home tamsulosin  0.4 mg daily at bedtime  Anxiety Insomnia Takes 0.25 mg lorazepam  PRN anxiety, including at night. Given his age and fall risk, would consider shared decision making regarding safety of continuing this medication long term, though will defer to PCP. Plan: -Lorazepam  0.25 mg daily at bedtime PRN anxiety   Dispo: Admit patient to Observation with expected length of stay less than 2 midnights.  Signed: Addie Perkins, DO 08/10/2023, 10:25 AM  After 5pm on weekdays and  1pm on weekends: On Call pager: [phone]

## 2023-08-10 NOTE — ED Triage Notes (Signed)
 Pt BIB [facility] from home. Per wife pt had syncopal episode. Pt does recall the event; and has small lac to left forehead. During episode pt was incontinent. Denies CP/SOB, c/o nausea.

## 2023-08-11 DIAGNOSIS — R319 Hematuria, unspecified: Secondary | ICD-10-CM | POA: Diagnosis not present

## 2023-08-11 DIAGNOSIS — R55 Syncope and collapse: Secondary | ICD-10-CM | POA: Diagnosis not present

## 2023-08-11 LAB — URINALYSIS, COMPLETE (UACMP) WITH MICROSCOPIC
Bacteria, UA: NONE SEEN
Bilirubin Urine: NEGATIVE
Glucose, UA: NEGATIVE mg/dL
Ketones, ur: NEGATIVE mg/dL
Leukocytes,Ua: NEGATIVE
Nitrite: NEGATIVE
Protein, ur: NEGATIVE mg/dL
RBC / HPF: >50 RBC/hpf (ref 0–5)
Specific Gravity, Urine: 1.015 (ref 1.005–1.030)
pH: 6 (ref 5.0–8.0)

## 2023-08-11 LAB — CBC
HCT: 37.6 % — ABNORMAL LOW (ref 39.0–52.0)
Hemoglobin: 13.3 g/dL (ref 13.0–17.0)
MCH: 30.5 pg (ref 26.0–34.0)
MCHC: 35.4 g/dL (ref 30.0–36.0)
MCV: 86.2 fL (ref 80.0–100.0)
Platelets: 166 10*3/uL (ref 150–400)
RBC: 4.36 MIL/uL (ref 4.22–5.81)
RDW: 12.8 % (ref 11.5–15.5)
WBC: 6.6 10*3/uL (ref 4.0–10.5)
nRBC: 0 % (ref 0.0–0.2)

## 2023-08-11 LAB — BASIC METABOLIC PANEL
Anion gap: 7 (ref 5–15)
BUN: 17 mg/dL (ref 8–23)
CO2: 26 mmol/L (ref 22–32)
Calcium: 8.5 mg/dL — ABNORMAL LOW (ref 8.9–10.3)
Chloride: 105 mmol/L (ref 98–111)
Creatinine, Ser: 1.03 mg/dL (ref 0.61–1.24)
GFR, Estimated: >60 mL/min (ref >60)
Glucose, Bld: 114 mg/dL — ABNORMAL HIGH (ref 70–99)
Potassium: 3.8 mmol/L (ref 3.5–5.1)
Sodium: 138 mmol/L (ref 135–145)

## 2023-08-11 NOTE — Care Management Obs Status (Signed)
 MEDICARE OBSERVATION STATUS NOTIFICATION   Patient Details  Name: Corey [name] MRN: [id number] Date of Birth: June 10, 1932   Medicare Observation Status Notification Given:  Yes    Joanette Moynahan, RN 08/11/2023, 8:37 AM

## 2023-08-11 NOTE — Discharge Instructions (Addendum)
 You were evaluated in the ED after passing out yesterday morning.  An extensive workup has been unremarkable in terms of etiology.  At this point, the suspected cause is vasovagal syncope which can be triggered by many things such as stress and bearing down. Another potential cause is orthostatic hypotension which is a drop in blood pressure with positional changes.  This can be further exacerbated by medications like tamsulosin  which you are taking for your enlarged prostate.  Please make an appointment with your primary care provider as soon as possible regarding this emergency visit.  Inquire about other options for your enlarged prostate such as finasteride.  As discussed, please get up slowly or ask for assistance if you develop symptoms similar to what you experienced leading up to this admission.

## 2023-08-11 NOTE — Discharge Summary (Signed)
 Name: Corey [name] MRN: [id number] DOB: 13-Dec-1931 89 y.o. PCP: Tura Gaines, MD  Date of Admission: 08/10/2023  5:39 AM Date of Discharge:  08/10/2022 Attending Physician: Dr. Alwin Baars  DISCHARGE DIAGNOSIS:  Primary Problem: Syncope and collapse   Hospital Problems: Principal Problem:   Syncope and collapse    DISCHARGE MEDICATIONS:   Allergies as of 08/11/2023       Reactions   Tizanidine Anaphylaxis, Other (See Comments)   Complete heart block, syncope   Zanaflex [tizanidine Hcl] Other (See Comments)   Severe bradycardia    Gabapentin  Other (See Comments)   Shaking chills At 300 mg bid   Ciprofloxacin Itching, Rash   Penicillins Itching, Rash   Has patient had a PCN reaction causing immediate rash, facial/tongue/throat swelling, SOB or lightheadedness with hypotension:yes Has patient had a PCN reaction causing severe rash involving mucus membranes or skin necrosis:no Has patient had a PCN reaction that required hospitalization:no Has patient had a PCN reaction occurring within the last 10 years:no If all of the above answers are "NO", then may proceed with Cephalosporin use.   Sulfa Antibiotics Itching, Rash        Medication List     TAKE these medications    ascorbic acid 500 MG tablet Commonly known as: VITAMIN C Take 500 mg by mouth 2 (two) times daily.   aspirin  EC 81 MG tablet Take 81 mg by mouth daily. Swallow whole.   GLUCOSAMINE PO Take 1,500 mg by mouth 2 (two) times daily.   LORazepam  0.5 MG tablet Commonly known as: ATIVAN  Take 0.25 mg by mouth 2 (two) times daily as needed for anxiety. Max 0.75 mg daily   naproxen 375 MG tablet Commonly known as: NAPROSYN Take 375 mg by mouth 2 (two) times daily as needed for mild pain (pain score 1-3) or moderate pain (pain score 4-6).   Potassium 99 MG Tabs Take 99 mg by mouth daily after lunch.   tamsulosin  0.4 MG Caps capsule Commonly known as: FLOMAX  Take 0.4 mg by mouth at bedtime.    telmisartan 80 MG tablet Commonly known as: MICARDIS Take 80 mg by mouth daily.   Turmeric 500 MG Caps Take 500 mg by mouth daily.        DISPOSITION AND FOLLOW-UP:  Mr.Corey [name] was discharged from [facility] in Stable condition. At the hospital follow up visit please address:  Syncope: Further assess orthostatics and consider alternative agent for BPH  Hematuria: Repeat UA and consider Urology referral as patient has not been seen recently   HOSPITAL COURSE:  Patient Summary:  Corey [name] is a 89 y.o. M with PMH of anxiety, hip and spine arthritis, peripheral artery disease, prior complete heart block, HTN, BPH who presented after an unwitnessed syncopal event at home admitted for syncope work up.   He explained that he normally gets up twice in the night to use the restroom. He wasn't sure if this was his first or second wake up of the night, but remembered feeling nauseous and when he got up, he passed out. He did not remember the event and did not remember if he made it to the bathroom. He did endorse some vomiting and dry heaving. Aside from this bout of nausea he felt generally well. No increased weakness, dizziness, palpitations, difficulty breathing, diarrhea. He had been eating and drinking well. He noted several episodes over many years of passing out when feeling suddenly nauseous and did not believe he  remembered the episodes when they happened. He did believe he took a lorazepam  prior to going to bed last night. He did urinate on himself and vomit during/after the event but did not have seizure like activity.   Of note he does have baseline unsteady gate due to arthritis and peripheral artery disease but does not use ambulatory assistive devices. He typically walks 20 minutes twice per day.   Work up including: EKG, troponin, CT Head, CXR, and telemetry monitoring all unremarkable. Initial orthostatics were indeterminate but repeat  were negative.  Patient remained asymptomatic.  After approximately 30 hours of observation patient and family requesting to leave.  Given the unremarkable workup, the most likely cause of his syncope is probably vasovagal.  Upon further questioning he has also had syncopal episode at the site of blood.  Less concern for orthostatic hypotension at this point patient has been on tamsulosin  for 15+ years and orthostatics were negative. During this admission, patient endorsed hematuria which he said has occurred a few times throughout the years.  He has been seen by urology who attributed this to prostate inflammation.  UA ordered which showed moderate hemoglobin.  Patient denied urinary symptoms. Although formal cause for all the above has not been identified, request to leave is granted as patient has family support at home and will have close follow-up with PCP.  Return precautions given.   DISCHARGE INSTRUCTIONS:   Discharge Instructions     Diet - low sodium heart healthy   Complete by: As directed    Increase activity slowly   Complete by: As directed        SUBJECTIVE:  Patient feels well overall. Denies syncope/presyncope since admission. Endorses one episode of hematuria. Denies dysuria. Discharge Vitals:   BP (!) 180/88   Pulse 88   Temp 97.9 F (36.6 C) (Oral)   Resp 15   Ht 5\' 7"  (1.702 m)   Wt 71.7 kg   SpO2 97%   BMI 24.75 kg/m   OBJECTIVE:  Physical Exam:  Constitutional: well-appearing, lying in bed, in no acute distress Cardiovascular: regular rate and rhythm, no m/r/g, no LEE Pulmonary/Chest: normal work of breathing on room air, lungs clear to auscultation bilaterally Abdominal: soft, non-tender, non-distended MSK: normal bulk and tone Neurological: alert & oriented Skin: warm and dry Psych: normal mood and behavior  Pertinent Labs, Studies, and Procedures:     Latest Ref Rng & Units 08/11/2023    4:06 AM 08/10/2023    6:02 AM 10/16/2019   11:22 AM  CBC   WBC 4.0 - 10.5 K/uL 6.6  12.4  7.4   Hemoglobin 13.0 - 17.0 g/dL 29.5  62.1  30.8   Hematocrit 39.0 - 52.0 % 37.6  37.7  43.9   Platelets 150 - 400 K/uL 166  161  223        Latest Ref Rng & Units 08/11/2023    4:06 AM 08/10/2023    6:02 AM 10/16/2019   11:22 AM  CMP  Glucose 70 - 99 mg/dL 657  846  962   BUN 8 - 23 mg/dL 17  17  17    Creatinine 0.61 - 1.24 mg/dL 9.52  8.41  3.24   Sodium 135 - 145 mmol/L 138  139  139   Potassium 3.5 - 5.1 mmol/L 3.8  3.5  4.1   Chloride 98 - 111 mmol/L 105  106  104   CO2 22 - 32 mmol/L 26  24  27    Calcium  8.9 - 10.3 mg/dL 8.5  7.7  9.3   Total Protein 6.5 - 8.1 g/dL  5.1  7.0   Total Bilirubin 0.0 - 1.2 mg/dL  1.1  1.0   Alkaline Phos 38 - 126 U/L  45  55   AST 15 - 41 U/L  16  18   ALT 0 - 44 U/L  13  17     CT HEAD WO CONTRAST ( ) Result Date: 08/10/2023 CLINICAL DATA:  Head trauma, minor. Syncopal episode. For at laceration. EXAM: CT HEAD WITHOUT CONTRAST TECHNIQUE: Contiguous axial images were obtained from the base of the skull through the vertex without intravenous contrast. RADIATION DOSE REDUCTION: This exam was performed according to the departmental dose-optimization program which includes automated exposure control, adjustment of the mA and/or kV according to patient size and/or use of iterative reconstruction technique. COMPARISON:  MRI 03/13/2018 FINDINGS: Brain: Generalized age related volume loss. Chronic small-vessel ischemic changes of the cerebral hemispheric white matter. 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 No sign of acute infarction, mass lesion, hemorrhage, hydrocephalus or extra-axial collection. Vascular: There is atherosclerotic calcification of the major vessels at the base of the brain. Skull: No skull fracture. Sinuses/Orbits: Clear/normal Other: Mild soft tissue swelling of the forehead. IMPRESSION: 1. No acute intracranial finding. Mild soft tissue swelling of the forehead. 2. Chronic small-vessel ischemic  changes of the cerebral hemispheric white matter. 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 Electronically Signed   By: Bettylou Brunner M.D.   On: 08/10/2023 07:47   DG Chest Portable 1 View Result Date: 08/10/2023 CLINICAL DATA:  89 year old male with history of syncope. EXAM: PORTABLE CHEST 1 VIEW COMPARISON:  Chest x-ray 02/17/2017. FINDINGS: Lung volumes are normal. No consolidative airspace disease. No pleural effusions. No pneumothorax. No pulmonary nodule or mass noted. Pulmonary vasculature and the cardiomediastinal silhouette are within normal limits. Atherosclerosis in the thoracic aorta. IMPRESSION: 1.  No radiographic evidence of acute cardiopulmonary disease. 2. Aortic atherosclerosis. Electronically Signed   By: Alexandria Angel M.D.   On: 08/10/2023 06:33     Signed: Ronni Colace, DO  Internal Medicine Resident, PGY-1 [facility]  Pager: [phone] 1:20 PM, 08/11/2023
